# Patient Record
Sex: Female | Born: 1990 | Race: Black or African American | Hispanic: No | Marital: Married | Smoking: Current some day smoker
Health system: Southern US, Community
[De-identification: ages and names within clinical notes are randomized; demographics above are authoritative.]

## PROBLEM LIST (undated history)

## (undated) ENCOUNTER — Inpatient Hospital Stay (HOSPITAL_COMMUNITY): Payer: Self-pay

## (undated) DIAGNOSIS — A749 Chlamydial infection, unspecified: Secondary | ICD-10-CM

## (undated) DIAGNOSIS — F419 Anxiety disorder, unspecified: Secondary | ICD-10-CM

## (undated) DIAGNOSIS — Z8619 Personal history of other infectious and parasitic diseases: Secondary | ICD-10-CM

## (undated) DIAGNOSIS — D649 Anemia, unspecified: Secondary | ICD-10-CM

## (undated) DIAGNOSIS — N83209 Unspecified ovarian cyst, unspecified side: Secondary | ICD-10-CM

## (undated) DIAGNOSIS — R0600 Dyspnea, unspecified: Secondary | ICD-10-CM

## (undated) HISTORY — DX: Personal history of other infectious and parasitic diseases: Z86.19

## (undated) HISTORY — DX: Anxiety disorder, unspecified: F41.9

## (undated) HISTORY — DX: Dyspnea, unspecified: R06.00

## (undated) HISTORY — DX: Anemia, unspecified: D64.9

## (undated) HISTORY — PX: OTHER SURGICAL HISTORY: SHX169

---

## 2009-04-25 ENCOUNTER — Emergency Department (HOSPITAL_COMMUNITY): Admission: EM | Admit: 2009-04-25 | Discharge: 2009-04-26 | Payer: Self-pay | Admitting: Emergency Medicine

## 2010-05-06 DIAGNOSIS — A749 Chlamydial infection, unspecified: Secondary | ICD-10-CM

## 2010-05-06 HISTORY — DX: Chlamydial infection, unspecified: A74.9

## 2010-05-10 ENCOUNTER — Inpatient Hospital Stay (HOSPITAL_COMMUNITY)
Admission: AD | Admit: 2010-05-10 | Discharge: 2010-05-10 | Payer: Self-pay | Source: Home / Self Care | Admitting: Obstetrics and Gynecology

## 2010-08-17 LAB — CBC
HCT: 33.8 % — ABNORMAL LOW (ref 36.0–46.0)
Hemoglobin: 10.6 g/dL — ABNORMAL LOW (ref 12.0–15.0)
MCH: 22.2 pg — ABNORMAL LOW (ref 26.0–34.0)
MCHC: 31.3 g/dL (ref 30.0–36.0)
RBC: 4.77 MIL/uL (ref 3.87–5.11)

## 2010-08-17 LAB — DIFFERENTIAL
Basophils Relative: 1 % (ref 0–1)
Lymphs Abs: 1.6 10*3/uL (ref 0.7–4.0)
Monocytes Absolute: 0.8 10*3/uL (ref 0.1–1.0)
Monocytes Relative: 11 % (ref 3–12)
Neutro Abs: 4.7 10*3/uL (ref 1.7–7.7)

## 2010-08-17 LAB — URINALYSIS, ROUTINE W REFLEX MICROSCOPIC
Glucose, UA: NEGATIVE mg/dL
Ketones, ur: 15 mg/dL — AB
Protein, ur: 100 mg/dL — AB

## 2010-08-17 LAB — URINE MICROSCOPIC-ADD ON

## 2010-08-17 LAB — GC/CHLAMYDIA PROBE AMP, GENITAL: Chlamydia, DNA Probe: POSITIVE — AB

## 2010-08-17 LAB — WET PREP, GENITAL: Yeast Wet Prep HPF POC: NONE SEEN

## 2010-09-08 LAB — POCT PREGNANCY, URINE: Preg Test, Ur: NEGATIVE

## 2010-09-08 LAB — DIFFERENTIAL
Basophils Absolute: 0.1 10*3/uL (ref 0.0–0.1)
Eosinophils Relative: 1 % (ref 0–5)
Lymphocytes Relative: 39 % (ref 12–46)
Monocytes Relative: 14 % — ABNORMAL HIGH (ref 3–12)
Neutrophils Relative %: 45 % (ref 43–77)

## 2010-09-08 LAB — CBC
HCT: 32.1 % — ABNORMAL LOW (ref 36.0–46.0)
Platelets: 220 10*3/uL (ref 150–400)
RDW: 16.3 % — ABNORMAL HIGH (ref 11.5–15.5)
WBC: 8 10*3/uL (ref 4.0–10.5)

## 2010-09-08 LAB — URINE MICROSCOPIC-ADD ON

## 2010-09-08 LAB — POCT I-STAT, CHEM 8
Chloride: 107 mEq/L (ref 96–112)
HCT: 35 % — ABNORMAL LOW (ref 36.0–46.0)
Hemoglobin: 11.9 g/dL — ABNORMAL LOW (ref 12.0–15.0)
Potassium: 3.7 mEq/L (ref 3.5–5.1)
Sodium: 139 mEq/L (ref 135–145)

## 2010-09-08 LAB — URINALYSIS, ROUTINE W REFLEX MICROSCOPIC
Bilirubin Urine: NEGATIVE
Glucose, UA: NEGATIVE mg/dL
Hgb urine dipstick: NEGATIVE
Specific Gravity, Urine: 1.037 — ABNORMAL HIGH (ref 1.005–1.030)
Urobilinogen, UA: 1 mg/dL (ref 0.0–1.0)

## 2010-09-08 LAB — D-DIMER, QUANTITATIVE: D-Dimer, Quant: <0.22 ug/mL-FEU (ref 0.00–0.48)

## 2010-10-20 ENCOUNTER — Inpatient Hospital Stay (HOSPITAL_COMMUNITY)
Admission: AD | Admit: 2010-10-20 | Discharge: 2010-10-20 | Disposition: A | Discharge status: Home / Self Care | Payer: Self-pay | Source: Ambulatory Visit | Attending: Family Medicine | Admitting: Family Medicine

## 2010-10-20 DIAGNOSIS — A499 Bacterial infection, unspecified: Secondary | ICD-10-CM | POA: Insufficient documentation

## 2010-10-20 DIAGNOSIS — B9689 Other specified bacterial agents as the cause of diseases classified elsewhere: Secondary | ICD-10-CM | POA: Insufficient documentation

## 2010-10-20 DIAGNOSIS — N760 Acute vaginitis: Secondary | ICD-10-CM | POA: Insufficient documentation

## 2010-10-20 DIAGNOSIS — R109 Unspecified abdominal pain: Secondary | ICD-10-CM | POA: Insufficient documentation

## 2010-10-20 LAB — HCG, SERUM, QUALITATIVE: Preg, Serum: NEGATIVE

## 2010-10-20 LAB — URINALYSIS, ROUTINE W REFLEX MICROSCOPIC
Glucose, UA: NEGATIVE mg/dL
Ketones, ur: NEGATIVE mg/dL
Leukocytes, UA: NEGATIVE
Protein, ur: 100 mg/dL — AB
Urobilinogen, UA: 1 mg/dL (ref 0.0–1.0)

## 2010-10-20 LAB — WET PREP, GENITAL: Trich, Wet Prep: NONE SEEN

## 2010-10-20 LAB — URINE MICROSCOPIC-ADD ON

## 2010-10-21 LAB — GC/CHLAMYDIA PROBE AMP, GENITAL: Chlamydia, DNA Probe: NEGATIVE

## 2011-02-11 ENCOUNTER — Inpatient Hospital Stay (HOSPITAL_COMMUNITY): Payer: Self-pay

## 2011-02-11 ENCOUNTER — Inpatient Hospital Stay (HOSPITAL_COMMUNITY)
Admission: AD | Admit: 2011-02-11 | Discharge: 2011-02-11 | Disposition: A | Discharge status: Home / Self Care | Payer: Self-pay | Source: Ambulatory Visit | Attending: Family Medicine | Admitting: Family Medicine

## 2011-02-11 ENCOUNTER — Encounter (HOSPITAL_COMMUNITY): Payer: Self-pay | Admitting: *Deleted

## 2011-02-11 DIAGNOSIS — A499 Bacterial infection, unspecified: Secondary | ICD-10-CM | POA: Insufficient documentation

## 2011-02-11 DIAGNOSIS — R102 Pelvic and perineal pain: Secondary | ICD-10-CM

## 2011-02-11 DIAGNOSIS — F17200 Nicotine dependence, unspecified, uncomplicated: Secondary | ICD-10-CM | POA: Insufficient documentation

## 2011-02-11 DIAGNOSIS — N760 Acute vaginitis: Secondary | ICD-10-CM | POA: Insufficient documentation

## 2011-02-11 DIAGNOSIS — B9689 Other specified bacterial agents as the cause of diseases classified elsewhere: Secondary | ICD-10-CM | POA: Insufficient documentation

## 2011-02-11 DIAGNOSIS — N949 Unspecified condition associated with female genital organs and menstrual cycle: Secondary | ICD-10-CM | POA: Insufficient documentation

## 2011-02-11 HISTORY — DX: Chlamydial infection, unspecified: A74.9

## 2011-02-11 LAB — URINALYSIS, ROUTINE W REFLEX MICROSCOPIC
Bilirubin Urine: NEGATIVE
Glucose, UA: NEGATIVE mg/dL
Ketones, ur: NEGATIVE mg/dL
Protein, ur: NEGATIVE mg/dL
pH: 6.5 (ref 5.0–8.0)

## 2011-02-11 LAB — WET PREP, GENITAL
Trich, Wet Prep: NONE SEEN
Yeast Wet Prep HPF POC: NONE SEEN

## 2011-02-11 LAB — URINE MICROSCOPIC-ADD ON

## 2011-02-11 MED ORDER — METRONIDAZOLE 500 MG PO TABS: 500.0000 mg | ORAL_TABLET | Freq: Two times a day (BID) | ORAL | Status: AC

## 2011-02-11 MED ORDER — IBUPROFEN 200 MG PO TABS
200.0000 mg | ORAL_TABLET | Freq: Once | ORAL | Status: AC
  Administered 2011-02-11: 200 mg via ORAL
  Filled 2011-02-11: qty 1

## 2011-02-11 MED ORDER — IBUPROFEN 400 MG PO TABS: 200.0000 mg | ORAL_TABLET | Freq: Once | ORAL | Status: DC
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
## 2011-02-11 NOTE — Progress Notes (Signed)
Lower abd pain past 2 wks, worse at night. Eases off with meds.  Vag itching, no discharg, no pain or  Burning with urination, no freq or urgency, occ nausea

## 2011-02-12 LAB — GC/CHLAMYDIA PROBE AMP, GENITAL
Chlamydia, DNA Probe: NEGATIVE
GC Probe Amp, Genital: NEGATIVE

## 2011-02-12 NOTE — ED Provider Notes (Signed)
Chart reviewed and agree with management and plan.  

## 2012-04-08 ENCOUNTER — Emergency Department (INDEPENDENT_AMBULATORY_CARE_PROVIDER_SITE_OTHER)
Admission: EM | Admit: 2012-04-08 | Discharge: 2012-04-08 | Disposition: A | Discharge status: Home / Self Care | Payer: Self-pay | Source: Home / Self Care | Attending: Emergency Medicine | Admitting: Emergency Medicine

## 2012-04-08 ENCOUNTER — Encounter (HOSPITAL_COMMUNITY): Payer: Self-pay | Admitting: Emergency Medicine

## 2012-04-08 DIAGNOSIS — N12 Tubulo-interstitial nephritis, not specified as acute or chronic: Secondary | ICD-10-CM

## 2012-04-08 DIAGNOSIS — N898 Other specified noninflammatory disorders of vagina: Secondary | ICD-10-CM

## 2012-04-08 LAB — POCT URINALYSIS DIP (DEVICE)
Ketones, ur: NEGATIVE mg/dL
Protein, ur: >=300 mg/dL — AB
pH: 6.5 (ref 5.0–8.0)

## 2012-04-08 LAB — WET PREP, GENITAL

## 2012-04-08 MED ORDER — METRONIDAZOLE 500 MG PO TABS: 500.0000 mg | ORAL_TABLET | Freq: Two times a day (BID) | ORAL | Status: DC

## 2012-04-08 MED ORDER — CEFTRIAXONE SODIUM 250 MG IJ SOLR
250.0000 mg | Freq: Once | INTRAMUSCULAR | Status: AC
  Administered 2012-04-08: 250 mg via INTRAMUSCULAR

## 2012-04-08 MED ORDER — CEPHALEXIN 500 MG PO CAPS: 500.0000 mg | ORAL_CAPSULE | Freq: Three times a day (TID) | ORAL | Status: DC

## 2012-04-08 MED ORDER — LIDOCAINE HCL (PF) 1 % IJ SOLN
INTRAMUSCULAR | Status: AC
  Filled 2012-04-08: qty 5

## 2012-04-08 MED ORDER — CEFTRIAXONE SODIUM 1 G IJ SOLR
INTRAMUSCULAR | Status: AC
  Filled 2012-04-08: qty 10
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
## 2012-04-08 NOTE — ED Notes (Addendum)
Pt is c/o pain over left ovary x 3 days and lower back pain that started last night.   Pt had a positive home preg. Test sept 16. And a episode of spotting x 2 days oct 3-4  Second preg. Hist. Of miss carry( Pt state fell down steps)  Pt has had no prenatal care at this point

## 2012-04-09 LAB — GC/CHLAMYDIA PROBE AMP, GENITAL: Chlamydia, DNA Probe: NEGATIVE

## 2012-04-09 NOTE — ED Provider Notes (Signed)
Medical screening examination/treatment/procedure(s) were performed by non-physician practitioner and as supervising physician I was immediately available for consultation/collaboration. Recommended nurse practitioner, CH to provide patient with 1 g of CEFTRIAXONE on followup in 48 hours. On chart review was noted that patient obtain a what appears to be suboptimal dose she will review chart to determine if in fact this was the dose given and adjust treatment according to  Raynald Blend, MD 04/09/12 1835

## 2012-04-09 NOTE — ED Notes (Signed)
GC , chlamydia reports are negative; wet prep shows no trich or yeast, moderate clue cells, and few WBB's adequately treated w Keflex , metronidazole

## 2012-07-31 ENCOUNTER — Inpatient Hospital Stay (HOSPITAL_COMMUNITY): Payer: Medicaid Other

## 2012-07-31 ENCOUNTER — Encounter (HOSPITAL_COMMUNITY): Payer: Self-pay

## 2012-07-31 ENCOUNTER — Inpatient Hospital Stay (HOSPITAL_COMMUNITY)
Admission: AD | Admit: 2012-07-31 | Discharge: 2012-07-31 | Disposition: A | Discharge status: Home / Self Care | Payer: Medicaid Other | Source: Ambulatory Visit | Attending: Obstetrics & Gynecology | Admitting: Obstetrics & Gynecology

## 2012-07-31 DIAGNOSIS — R109 Unspecified abdominal pain: Secondary | ICD-10-CM | POA: Insufficient documentation

## 2012-07-31 DIAGNOSIS — O210 Mild hyperemesis gravidarum: Secondary | ICD-10-CM | POA: Insufficient documentation

## 2012-07-31 DIAGNOSIS — O99891 Other specified diseases and conditions complicating pregnancy: Secondary | ICD-10-CM | POA: Insufficient documentation

## 2012-07-31 LAB — CBC WITH DIFFERENTIAL/PLATELET
HCT: 32.3 % — ABNORMAL LOW (ref 36.0–46.0)
Hemoglobin: 10.4 g/dL — ABNORMAL LOW (ref 12.0–15.0)
Lymphocytes Relative: 29 % (ref 12–46)
Lymphs Abs: 2.1 10*3/uL (ref 0.7–4.0)
Monocytes Absolute: 0.7 10*3/uL (ref 0.1–1.0)
Monocytes Relative: 10 % (ref 3–12)
Neutro Abs: 4.5 10*3/uL (ref 1.7–7.7)
RBC: 4.58 MIL/uL (ref 3.87–5.11)
WBC: 7.4 10*3/uL (ref 4.0–10.5)

## 2012-07-31 LAB — URINALYSIS, ROUTINE W REFLEX MICROSCOPIC
Bilirubin Urine: NEGATIVE
Glucose, UA: NEGATIVE mg/dL
Hgb urine dipstick: NEGATIVE
Ketones, ur: 15 mg/dL — AB
Leukocytes, UA: NEGATIVE
Nitrite: NEGATIVE
Protein, ur: NEGATIVE mg/dL
Specific Gravity, Urine: 1.025 (ref 1.005–1.030)
Urobilinogen, UA: 1 mg/dL (ref 0.0–1.0)
pH: 7 (ref 5.0–8.0)

## 2012-07-31 LAB — WET PREP, GENITAL
Trich, Wet Prep: NONE SEEN
Yeast Wet Prep HPF POC: NONE SEEN

## 2012-07-31 MED ORDER — ONDANSETRON 8 MG PO TBDP: 8.0000 mg | ORAL_TABLET | Freq: Three times a day (TID) | ORAL | Status: DC | PRN

## 2012-07-31 MED ORDER — ONDANSETRON 8 MG PO TBDP: 8.0000 mg | ORAL_TABLET | Freq: Once | ORAL | Status: DC

## 2012-07-31 NOTE — MAU Note (Signed)
LMP 1/3, having nausea, kidney pain and stomach pain.

## 2012-07-31 NOTE — MAU Provider Note (Signed)
Attestation of Attending Supervision of Advanced Practitioner (CNM/NP): Evaluation and management procedures were performed by the Advanced Practitioner under my supervision and collaboration.  I have reviewed the Advanced Practitioner's note and chart, and I agree with the management and plan.  HARRAWAY-SMITH, Wyeth Hoffer 11:08 PM     
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
## 2012-08-01 LAB — GC/CHLAMYDIA PROBE AMP: GC Probe RNA: NEGATIVE

## 2012-08-13 ENCOUNTER — Inpatient Hospital Stay (HOSPITAL_COMMUNITY)
Admission: AD | Admit: 2012-08-13 | Discharge: 2012-08-13 | Disposition: A | Discharge status: Home / Self Care | Payer: Medicaid Other | Source: Ambulatory Visit | Attending: Obstetrics & Gynecology | Admitting: Obstetrics & Gynecology

## 2012-08-13 ENCOUNTER — Encounter (HOSPITAL_COMMUNITY): Payer: Self-pay | Admitting: *Deleted

## 2012-08-13 DIAGNOSIS — B9689 Other specified bacterial agents as the cause of diseases classified elsewhere: Secondary | ICD-10-CM | POA: Insufficient documentation

## 2012-08-13 DIAGNOSIS — O26899 Other specified pregnancy related conditions, unspecified trimester: Secondary | ICD-10-CM

## 2012-08-13 DIAGNOSIS — N760 Acute vaginitis: Secondary | ICD-10-CM | POA: Insufficient documentation

## 2012-08-13 DIAGNOSIS — O2390 Unspecified genitourinary tract infection in pregnancy, unspecified trimester: Secondary | ICD-10-CM | POA: Insufficient documentation

## 2012-08-13 DIAGNOSIS — R1032 Left lower quadrant pain: Secondary | ICD-10-CM | POA: Insufficient documentation

## 2012-08-13 LAB — URINALYSIS, ROUTINE W REFLEX MICROSCOPIC
Bilirubin Urine: NEGATIVE
Glucose, UA: NEGATIVE mg/dL
Ketones, ur: NEGATIVE mg/dL
pH: 7 (ref 5.0–8.0)

## 2012-08-13 LAB — WET PREP, GENITAL

## 2012-08-13 MED ORDER — METRONIDAZOLE 500 MG PO TABS: 500.0000 mg | ORAL_TABLET | Freq: Two times a day (BID) | ORAL | Status: AC

## 2012-08-13 NOTE — MAU Note (Signed)
Was seen in MAU 2/25. Thought she had a stomach virus and was told she was pregnant. Was positive for BV, but not treated. States She has a vag. D/C, no odor. GC/Chl, trich were negative.

## 2012-08-13 NOTE — MAU Note (Signed)
Patient states she has been having lower abdominal pain for about 3 days. Denies bleeding but does have a heavy white vaginal discharge with no odor.
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
## 2012-08-14 LAB — GC/CHLAMYDIA PROBE AMP: GC Probe RNA: NEGATIVE

## 2012-09-06 LAB — OB RESULTS CONSOLE RPR: RPR: NONREACTIVE

## 2012-09-06 LAB — OB RESULTS CONSOLE HIV ANTIBODY (ROUTINE TESTING): HIV: NONREACTIVE

## 2013-02-22 LAB — OB RESULTS CONSOLE GC/CHLAMYDIA
Chlamydia: NEGATIVE
Gonorrhea: NEGATIVE

## 2013-02-22 LAB — OB RESULTS CONSOLE GBS: GBS: POSITIVE

## 2013-03-15 ENCOUNTER — Inpatient Hospital Stay (HOSPITAL_COMMUNITY)
Admission: AD | Admit: 2013-03-15 | Discharge: 2013-03-15 | Disposition: A | Discharge status: Home / Self Care | Payer: Medicaid Other | Source: Ambulatory Visit | Attending: Obstetrics and Gynecology | Admitting: Obstetrics and Gynecology

## 2013-03-15 ENCOUNTER — Encounter (HOSPITAL_COMMUNITY): Payer: Self-pay | Admitting: *Deleted

## 2013-03-15 DIAGNOSIS — O471 False labor at or after 37 completed weeks of gestation: Secondary | ICD-10-CM

## 2013-03-15 DIAGNOSIS — O479 False labor, unspecified: Secondary | ICD-10-CM | POA: Insufficient documentation

## 2013-03-15 LAB — AMNISURE RUPTURE OF MEMBRANE (ROM) NOT AT ARMC: Amnisure ROM: NEGATIVE
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
## 2013-03-15 NOTE — MAU Note (Signed)
Pt reports her water broke at home around 45 min ago. Clear fluid. Called EMS. reports had contraction on and off all day.

## 2013-03-21 ENCOUNTER — Encounter (HOSPITAL_COMMUNITY): Payer: Self-pay | Admitting: *Deleted

## 2013-03-21 ENCOUNTER — Inpatient Hospital Stay (HOSPITAL_COMMUNITY): Payer: Medicaid Other

## 2013-03-21 ENCOUNTER — Inpatient Hospital Stay (HOSPITAL_COMMUNITY)
Admission: AD | Admit: 2013-03-21 | Discharge: 2013-03-21 | Disposition: A | Discharge status: Home / Self Care | Payer: Medicaid Other | Source: Ambulatory Visit | Attending: Obstetrics and Gynecology | Admitting: Obstetrics and Gynecology

## 2013-03-21 DIAGNOSIS — O368190 Decreased fetal movements, unspecified trimester, not applicable or unspecified: Secondary | ICD-10-CM | POA: Insufficient documentation

## 2013-03-21 MED ORDER — BUTORPHANOL TARTRATE 1 MG/ML IJ SOLN
1.0000 mg | Freq: Once | INTRAMUSCULAR | Status: AC
  Administered 2013-03-21: 1 mg via INTRAMUSCULAR
  Filled 2013-03-21: qty 1

## 2013-03-21 NOTE — MAU Note (Signed)
Scheduled for inducion on Tuesday @ 0700;

## 2013-03-21 NOTE — MAU Note (Signed)
Contractions started around 0100, every 3-4 min.  Baby not moving as much this morning. No leaking or bleeding.  abd draws up, pain shoots through to back and has pressure on anus.  Pain was worse earlier.  Last check was 1cm
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
## 2013-03-22 ENCOUNTER — Encounter (HOSPITAL_COMMUNITY): Payer: Self-pay | Admitting: *Deleted

## 2013-03-22 ENCOUNTER — Telehealth (HOSPITAL_COMMUNITY): Payer: Self-pay | Admitting: *Deleted

## 2013-03-22 NOTE — Telephone Encounter (Signed)
Preadmission screen  

## 2013-03-26 ENCOUNTER — Encounter (HOSPITAL_COMMUNITY): Payer: Self-pay

## 2013-03-26 ENCOUNTER — Inpatient Hospital Stay (HOSPITAL_COMMUNITY)
Admission: RE | Admit: 2013-03-26 | Discharge: 2013-03-30 | DRG: 765 | Disposition: A | Discharge status: Home / Self Care | Payer: Medicaid Other | Source: Ambulatory Visit | Attending: Obstetrics and Gynecology | Admitting: Obstetrics and Gynecology

## 2013-03-26 VITALS — BP 126/65 | HR 81 | Temp 98.3°F | Resp 18 | Ht 61.0 in | Wt 167.0 lb

## 2013-03-26 DIAGNOSIS — Z22330 Carrier of Group B streptococcus: Secondary | ICD-10-CM

## 2013-03-26 DIAGNOSIS — O411090 Infection of amniotic sac and membranes, unspecified, unspecified trimester, not applicable or unspecified: Secondary | ICD-10-CM | POA: Diagnosis present

## 2013-03-26 DIAGNOSIS — O99820 Streptococcus B carrier state complicating pregnancy: Secondary | ICD-10-CM

## 2013-03-26 DIAGNOSIS — D62 Acute posthemorrhagic anemia: Secondary | ICD-10-CM | POA: Diagnosis not present

## 2013-03-26 DIAGNOSIS — O99334 Smoking (tobacco) complicating childbirth: Secondary | ICD-10-CM | POA: Diagnosis present

## 2013-03-26 DIAGNOSIS — O99892 Other specified diseases and conditions complicating childbirth: Secondary | ICD-10-CM | POA: Diagnosis present

## 2013-03-26 DIAGNOSIS — F17200 Nicotine dependence, unspecified, uncomplicated: Secondary | ICD-10-CM | POA: Insufficient documentation

## 2013-03-26 DIAGNOSIS — Z98891 History of uterine scar from previous surgery: Secondary | ICD-10-CM

## 2013-03-26 DIAGNOSIS — O480 Post-term pregnancy: Secondary | ICD-10-CM | POA: Diagnosis present

## 2013-03-26 DIAGNOSIS — O9903 Anemia complicating the puerperium: Secondary | ICD-10-CM | POA: Diagnosis not present

## 2013-03-26 DIAGNOSIS — IMO0002 Reserved for concepts with insufficient information to code with codable children: Secondary | ICD-10-CM | POA: Diagnosis present

## 2013-03-26 LAB — CBC
HCT: 31.7 % — ABNORMAL LOW (ref 36.0–46.0)
Hemoglobin: 10.2 g/dL — ABNORMAL LOW (ref 12.0–15.0)
MCV: 67.4 fL — ABNORMAL LOW (ref 78.0–100.0)
Platelets: 201 10*3/uL (ref 150–400)
RBC: 4.7 MIL/uL (ref 3.87–5.11)
RDW: 14.8 % (ref 11.5–15.5)
WBC: 10 10*3/uL (ref 4.0–10.5)

## 2013-03-26 MED ORDER — PENICILLIN G POTASSIUM 5000000 UNITS IJ SOLR
2.5000 10*6.[IU] | INTRAMUSCULAR | Status: DC
  Administered 2013-03-27: 2.5 10*6.[IU] via INTRAVENOUS
  Filled 2013-03-26 (×5): qty 2.5

## 2013-03-26 MED ORDER — FENTANYL CITRATE 0.05 MG/ML IJ SOLN
100.0000 ug | INTRAMUSCULAR | Status: DC | PRN
  Administered 2013-03-27 (×2): 100 ug via INTRAVENOUS
  Filled 2013-03-26 (×2): qty 2

## 2013-03-26 MED ORDER — LACTATED RINGERS IV SOLN
500.0000 mL | INTRAVENOUS | Status: DC | PRN
  Administered 2013-03-26: 1000 mL via INTRAVENOUS

## 2013-03-26 MED ORDER — PENICILLIN G POTASSIUM 5000000 UNITS IJ SOLR
5.0000 10*6.[IU] | Freq: Once | INTRAVENOUS | Status: AC
  Administered 2013-03-26: 5 10*6.[IU] via INTRAVENOUS
  Filled 2013-03-26: qty 5

## 2013-03-26 MED ORDER — TERBUTALINE SULFATE 1 MG/ML IJ SOLN
0.2500 mg | Freq: Once | INTRAMUSCULAR | Status: AC | PRN
  Filled 2013-03-26: qty 1

## 2013-03-26 MED ORDER — ACETAMINOPHEN 325 MG PO TABS
650.0000 mg | ORAL_TABLET | ORAL | Status: DC | PRN
  Administered 2013-03-27: 650 mg via ORAL
  Filled 2013-03-26: qty 2

## 2013-03-26 MED ORDER — OXYTOCIN 40 UNITS IN LACTATED RINGERS INFUSION - SIMPLE MED
1.0000 m[IU]/min | INTRAVENOUS | Status: DC
  Administered 2013-03-26: 1 m[IU]/min via INTRAVENOUS
  Administered 2013-03-27: 7 m[IU]/min via INTRAVENOUS
  Filled 2013-03-26: qty 1000

## 2013-03-26 MED ORDER — CITRIC ACID-SODIUM CITRATE 334-500 MG/5ML PO SOLN
30.0000 mL | ORAL | Status: DC | PRN
  Administered 2013-03-27: 30 mL via ORAL
  Filled 2013-03-26: qty 15

## 2013-03-26 MED ORDER — OXYTOCIN 40 UNITS IN LACTATED RINGERS INFUSION - SIMPLE MED: 62.5000 mL/h | INTRAVENOUS | Status: DC

## 2013-03-26 MED ORDER — OXYCODONE-ACETAMINOPHEN 5-325 MG PO TABS: 1.0000 | ORAL_TABLET | ORAL | Status: DC | PRN

## 2013-03-26 MED ORDER — ONDANSETRON HCL 4 MG/2ML IJ SOLN: 4.0000 mg | Freq: Four times a day (QID) | INTRAMUSCULAR | Status: DC | PRN

## 2013-03-26 MED ORDER — LIDOCAINE HCL (PF) 1 % IJ SOLN: 30.0000 mL | INTRAMUSCULAR | Status: DC | PRN

## 2013-03-26 MED ORDER — OXYTOCIN BOLUS FROM INFUSION: 500.0000 mL | INTRAVENOUS | Status: DC

## 2013-03-26 MED ORDER — IBUPROFEN 600 MG PO TABS: 600.0000 mg | ORAL_TABLET | Freq: Four times a day (QID) | ORAL | Status: DC | PRN

## 2013-03-26 MED ORDER — LACTATED RINGERS IV SOLN
INTRAVENOUS | Status: DC
  Administered 2013-03-26 – 2013-03-27 (×4): via INTRAVENOUS

## 2013-03-26 NOTE — Progress Notes (Signed)
  Subjective: Called to [facility] for a prolong decel.  Pt was in trendelenburg on left side with bolus going and O2.  RN reported already having pt on right side and on hands and knees.  SVE preformed, during SVE Dr. Macon Large entered and suggested FSE, one failed attempt with ROM - thick particulate mec noted.  Dr. Macon Large was able to place FSE and also placed IUPC.  Dr. Stefano Gaul then entered room as FHT returned to baseline.  Dr. Stefano Gaul offered reassurance to pt and discussed the potential need for c/s if baby does not tolerate labor.  R/B/A of c/s discussed.      Objective: BP 134/9  Pulse 87  Temp(Src) 98.4 F (36.9 C) (Oral)  Resp 18  Ht 5\' 1"  (1.549 m)  Wt 178 lb (80.74 kg)  BMI 33.65 kg/m2  LMP 06/08/2012      FHT:  FHR: 130 bpm, variability: moderate,  accelerations:  Present,  decelerations:  Present at 2046 FHT went down to 50 for about 10 min returning to baseline with min variability.  UC:   regular, every 2-4 minutes  SVE:   Dilation: 3 Effacement (%): 70 Station: -2 Exam by:: harraway smith md  Assessment / Plan:  Labor: IOL for PD; Pitocin delayed to observe FHT  Preeclampsia: no s/s Fetal Wellbeing: Cat II Pain Control: Breathing and relaxation; UCs have become more intense since AROM I/D: GBS pos; PCN prophylaxis; AROM at 2045 - thick mec; afibrile Anticipated MOD: SVD vs c/s    Cathy Elliott 03/26/2013, 9:14 PM

## 2013-03-26 NOTE — Progress Notes (Signed)
I was called to see this patient who had a prolonged fetal heart rate deceleration. The deceleration has now resolved. I reviewed the risks of fetal intolerance of labor. Cesarean delivery was outlined. Risks and benefits were reviewed. Questions were answered. The permit was signed. The patients contractions are increasing. We will observe for now.  Dr. Stefano Gaul
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
## 2013-03-26 NOTE — Progress Notes (Signed)
FHR= Cat 1. Dr. Stefano Gaul

## 2013-03-26 NOTE — Progress Notes (Signed)
  Subjective: Observing FHT since prolong decel - Variability is moderate, BL 130, no decels, accelerations presents.  Objective: BP 129/84  Pulse 73  Temp(Src) 98 F (36.7 C) (Oral)  Resp 20  Ht 5\' 1"  (1.549 m)  Wt 178 lb (80.74 kg)  BMI 33.65 kg/m2  SpO2 100%  LMP 06/08/2012      FHT:  Cat I UC:   regular, every 2-3 minutes   Assessment / Plan:  Labor: IOL for PD; Pitocin started with close observation of FHT Preeclampsia: 151/96 during decel; likely d/t anxiety;  no s/s; will continue to observe closely Fetal Wellbeing: Cat I Pain Control: Breathing and relaxation I/D: GBS pos; PCN prophylaxis; AROM at 2045 - thick mec; afibrile  Anticipated MOD: SVD vs c/s     Cathy Elliott 03/26/2013, 10:02 PM

## 2013-03-27 ENCOUNTER — Encounter (HOSPITAL_COMMUNITY): Payer: Self-pay

## 2013-03-27 ENCOUNTER — Inpatient Hospital Stay (HOSPITAL_COMMUNITY): Payer: Medicaid Other | Admitting: Anesthesiology

## 2013-03-27 ENCOUNTER — Encounter (HOSPITAL_COMMUNITY)
Admission: RE | Disposition: A | Discharge status: Home / Self Care | Payer: Self-pay | Source: Ambulatory Visit | Attending: Obstetrics and Gynecology

## 2013-03-27 ENCOUNTER — Encounter (HOSPITAL_COMMUNITY): Payer: Medicaid Other | Admitting: Anesthesiology

## 2013-03-27 LAB — COMPREHENSIVE METABOLIC PANEL
ALT: 9 U/L (ref 0–35)
AST: 21 U/L (ref 0–37)
AST: 20 U/L (ref 0–37)
Albumin: 2.9 g/dL — ABNORMAL LOW (ref 3.5–5.2)
Albumin: 2.5 g/dL — ABNORMAL LOW (ref 3.5–5.2)
Alkaline Phosphatase: 161 U/L — ABNORMAL HIGH (ref 39–117)
Alkaline Phosphatase: 153 U/L — ABNORMAL HIGH (ref 39–117)
BUN: 7 mg/dL (ref 6–23)
CO2: 21 mEq/L (ref 19–32)
Calcium: 9.5 mg/dL (ref 8.4–10.5)
Chloride: 105 mEq/L (ref 96–112)
Chloride: 103 mEq/L (ref 96–112)
Creatinine, Ser: 0.64 mg/dL (ref 0.50–1.10)
GFR calc Af Amer: >90 mL/min (ref >90)
Potassium: 4.2 mEq/L (ref 3.5–5.1)
Potassium: 4 mEq/L (ref 3.5–5.1)
Sodium: 134 mEq/L — ABNORMAL LOW (ref 135–145)
Total Bilirubin: 0.2 mg/dL — ABNORMAL LOW (ref 0.3–1.2)
Total Bilirubin: 0.4 mg/dL (ref 0.3–1.2)
Total Protein: 6.1 g/dL (ref 6.0–8.3)

## 2013-03-27 LAB — TYPE AND SCREEN
ABO/RH(D): A POS
Antibody Screen: NEGATIVE

## 2013-03-27 LAB — CBC
HCT: 29.4 % — ABNORMAL LOW (ref 36.0–46.0)
MCH: 21.8 pg — ABNORMAL LOW (ref 26.0–34.0)
MCHC: 33 g/dL (ref 30.0–36.0)
MCHC: 32.5 g/dL (ref 30.0–36.0)
MCV: 67.3 fL — ABNORMAL LOW (ref 78.0–100.0)
Platelets: 208 10*3/uL (ref 150–400)
Platelets: 170 10*3/uL (ref 150–400)
Platelets: 225 10*3/uL (ref 150–400)
RBC: 4.28 MIL/uL (ref 3.87–5.11)
RBC: 4.67 MIL/uL (ref 3.87–5.11)
RDW: 14.9 % (ref 11.5–15.5)
RDW: 14.8 % (ref 11.5–15.5)
WBC: 19.6 10*3/uL — ABNORMAL HIGH (ref 4.0–10.5)
WBC: 21.4 10*3/uL — ABNORMAL HIGH (ref 4.0–10.5)
WBC: 20.1 10*3/uL — ABNORMAL HIGH (ref 4.0–10.5)

## 2013-03-27 LAB — URIC ACID
Uric Acid, Serum: 5 mg/dL (ref 2.4–7.0)
Uric Acid, Serum: 5.1 mg/dL (ref 2.4–7.0)

## 2013-03-27 LAB — LACTATE DEHYDROGENASE: LDH: 180 U/L (ref 94–250)

## 2013-03-27 LAB — PROTEIN / CREATININE RATIO, URINE
Creatinine, Urine: 75.74 mg/dL
Protein Creatinine Ratio: 0.55 — ABNORMAL HIGH (ref 0.00–0.15)

## 2013-03-27 SURGERY — Surgical Case
Anesthesia: Epidural | Site: Abdomen | Wound class: Clean Contaminated

## 2013-03-27 MED ORDER — MEPERIDINE HCL 25 MG/ML IJ SOLN: 6.2500 mg | INTRAMUSCULAR | Status: DC | PRN

## 2013-03-27 MED ORDER — LACTATED RINGERS IV SOLN
INTRAVENOUS | Status: DC
  Administered 2013-03-27: 11:00:00 via INTRAUTERINE

## 2013-03-27 MED ORDER — FERROUS SULFATE 325 (65 FE) MG PO TABS
325.0000 mg | ORAL_TABLET | Freq: Two times a day (BID) | ORAL | Status: DC
  Administered 2013-03-27 – 2013-03-30 (×6): 325 mg via ORAL
  Filled 2013-03-27 (×6): qty 1

## 2013-03-27 MED ORDER — NALBUPHINE SYRINGE 5 MG/0.5 ML
5.0000 mg | INJECTION | INTRAMUSCULAR | Status: DC | PRN
  Filled 2013-03-27: qty 1

## 2013-03-27 MED ORDER — SIMETHICONE 80 MG PO CHEW: 80.0000 mg | CHEWABLE_TABLET | ORAL | Status: DC | PRN

## 2013-03-27 MED ORDER — SCOPOLAMINE 1 MG/3DAYS TD PT72
1.0000 | MEDICATED_PATCH | Freq: Once | TRANSDERMAL | Status: DC
  Administered 2013-03-27: 1.5 mg via TRANSDERMAL

## 2013-03-27 MED ORDER — DIPHENHYDRAMINE HCL 25 MG PO CAPS
25.0000 mg | ORAL_CAPSULE | ORAL | Status: DC | PRN
  Filled 2013-03-27: qty 1

## 2013-03-27 MED ORDER — DIPHENHYDRAMINE HCL 50 MG/ML IJ SOLN: 25.0000 mg | INTRAMUSCULAR | Status: DC | PRN

## 2013-03-27 MED ORDER — MAGNESIUM SULFATE 40 G IN LACTATED RINGERS - SIMPLE: 2.0000 g/h | Freq: Once | INTRAVENOUS | Status: DC

## 2013-03-27 MED ORDER — DIBUCAINE 1 % RE OINT: 1.0000 "application " | TOPICAL_OINTMENT | RECTAL | Status: DC | PRN

## 2013-03-27 MED ORDER — FLEET ENEMA 7-19 GM/118ML RE ENEM: 1.0000 | ENEMA | Freq: Every day | RECTAL | Status: DC | PRN

## 2013-03-27 MED ORDER — ZOLPIDEM TARTRATE 5 MG PO TABS: 5.0000 mg | ORAL_TABLET | Freq: Every evening | ORAL | Status: DC | PRN

## 2013-03-27 MED ORDER — MEASLES, MUMPS & RUBELLA VAC ~~LOC~~ INJ: 0.5000 mL | INJECTION | Freq: Once | SUBCUTANEOUS | Status: DC

## 2013-03-27 MED ORDER — ONDANSETRON HCL 4 MG PO TABS: 4.0000 mg | ORAL_TABLET | ORAL | Status: DC | PRN

## 2013-03-27 MED ORDER — PHENYLEPHRINE 8 MG IN D5W 100 ML (0.08MG/ML) PREMIX OPTIME: INJECTION | INTRAVENOUS | Status: DC | PRN

## 2013-03-27 MED ORDER — ONDANSETRON HCL 4 MG/2ML IJ SOLN: 4.0000 mg | Freq: Three times a day (TID) | INTRAMUSCULAR | Status: DC | PRN

## 2013-03-27 MED ORDER — LACTATED RINGERS IV SOLN
INTRAVENOUS | Status: DC | PRN
  Administered 2013-03-27 (×2): via INTRAVENOUS

## 2013-03-27 MED ORDER — EPHEDRINE 5 MG/ML INJ
10.0000 mg | INTRAVENOUS | Status: DC | PRN
  Filled 2013-03-27: qty 4

## 2013-03-27 MED ORDER — MORPHINE SULFATE (PF) 0.5 MG/ML IJ SOLN
INTRAMUSCULAR | Status: DC | PRN
  Administered 2013-03-27: 4 mg via EPIDURAL

## 2013-03-27 MED ORDER — MAGNESIUM SULFATE 40 G IN LACTATED RINGERS - SIMPLE
2.0000 g/h | INTRAVENOUS | Status: DC
  Administered 2013-03-27: 2 g/h via INTRAVENOUS
  Filled 2013-03-27: qty 500

## 2013-03-27 MED ORDER — SCOPOLAMINE 1 MG/3DAYS TD PT72
MEDICATED_PATCH | TRANSDERMAL | Status: AC
  Filled 2013-03-27: qty 1

## 2013-03-27 MED ORDER — LANOLIN HYDROUS EX OINT: 1.0000 "application " | TOPICAL_OINTMENT | CUTANEOUS | Status: DC | PRN

## 2013-03-27 MED ORDER — MORPHINE SULFATE 0.5 MG/ML IJ SOLN
INTRAMUSCULAR | Status: AC
  Filled 2013-03-27: qty 10

## 2013-03-27 MED ORDER — AMPICILLIN-SULBACTAM SODIUM 3 (2-1) G IJ SOLR
3.0000 g | Freq: Four times a day (QID) | INTRAMUSCULAR | Status: AC
  Administered 2013-03-27 – 2013-03-28 (×4): 3 g via INTRAVENOUS
  Filled 2013-03-27 (×7): qty 3

## 2013-03-27 MED ORDER — NALOXONE HCL 0.4 MG/ML IJ SOLN: 0.4000 mg | INTRAMUSCULAR | Status: DC | PRN

## 2013-03-27 MED ORDER — DIPHENHYDRAMINE HCL 50 MG/ML IJ SOLN: 12.5000 mg | INTRAMUSCULAR | Status: DC | PRN

## 2013-03-27 MED ORDER — KETOROLAC TROMETHAMINE 60 MG/2ML IM SOLN
INTRAMUSCULAR | Status: AC
  Administered 2013-03-27: 60 mg via INTRAMUSCULAR
  Filled 2013-03-27: qty 2

## 2013-03-27 MED ORDER — OXYTOCIN 10 UNIT/ML IJ SOLN
40.0000 [IU] | INTRAVENOUS | Status: DC | PRN
  Administered 2013-03-27: 40 [IU] via INTRAVENOUS

## 2013-03-27 MED ORDER — OXYCODONE-ACETAMINOPHEN 5-325 MG PO TABS
1.0000 | ORAL_TABLET | ORAL | Status: DC | PRN
  Administered 2013-03-28 – 2013-03-29 (×4): 1 via ORAL
  Filled 2013-03-27 (×4): qty 1

## 2013-03-27 MED ORDER — SIMETHICONE 80 MG PO CHEW
80.0000 mg | CHEWABLE_TABLET | Freq: Three times a day (TID) | ORAL | Status: DC
  Administered 2013-03-27 – 2013-03-30 (×7): 80 mg via ORAL
  Filled 2013-03-27 (×9): qty 1

## 2013-03-27 MED ORDER — KETOROLAC TROMETHAMINE 30 MG/ML IJ SOLN: 30.0000 mg | Freq: Four times a day (QID) | INTRAMUSCULAR | Status: DC | PRN

## 2013-03-27 MED ORDER — MAGNESIUM SULFATE 40 G IN LACTATED RINGERS - SIMPLE
2.0000 g/h | INTRAVENOUS | Status: AC
  Administered 2013-03-28: 2 g/h via INTRAVENOUS
  Filled 2013-03-27: qty 500

## 2013-03-27 MED ORDER — NALOXONE HCL 1 MG/ML IJ SOLN
1.0000 ug/kg/h | INTRAVENOUS | Status: DC | PRN
  Filled 2013-03-27: qty 2

## 2013-03-27 MED ORDER — MENTHOL 3 MG MT LOZG: 1.0000 | LOZENGE | OROMUCOSAL | Status: DC | PRN

## 2013-03-27 MED ORDER — LACTATED RINGERS IV SOLN
500.0000 mL | Freq: Once | INTRAVENOUS | Status: AC
  Administered 2013-03-27: 500 mL via INTRAVENOUS

## 2013-03-27 MED ORDER — FENTANYL CITRATE 0.05 MG/ML IJ SOLN
25.0000 ug | INTRAMUSCULAR | Status: DC | PRN
  Administered 2013-03-27 (×2): 50 ug via INTRAVENOUS

## 2013-03-27 MED ORDER — KETOROLAC TROMETHAMINE 60 MG/2ML IM SOLN
60.0000 mg | Freq: Once | INTRAMUSCULAR | Status: AC | PRN
  Administered 2013-03-27: 60 mg via INTRAMUSCULAR

## 2013-03-27 MED ORDER — SODIUM CHLORIDE 0.9 % IV SOLN
3.0000 g | Freq: Four times a day (QID) | INTRAVENOUS | Status: DC
  Administered 2013-03-27 (×2): 3 g via INTRAVENOUS
  Filled 2013-03-27 (×3): qty 3

## 2013-03-27 MED ORDER — FENTANYL 2.5 MCG/ML BUPIVACAINE 1/10 % EPIDURAL INFUSION (WH - ANES)
14.0000 mL/h | INTRAMUSCULAR | Status: DC | PRN
  Administered 2013-03-27: 14 mL/h via EPIDURAL
  Filled 2013-03-27: qty 125

## 2013-03-27 MED ORDER — ONDANSETRON HCL 4 MG/2ML IJ SOLN: 4.0000 mg | INTRAMUSCULAR | Status: DC | PRN

## 2013-03-27 MED ORDER — PHENYLEPHRINE HCL 10 MG/ML IJ SOLN
INTRAMUSCULAR | Status: DC | PRN
  Administered 2013-03-27 (×2): 80 ug via INTRAVENOUS
  Administered 2013-03-27: 40 ug via INTRAVENOUS

## 2013-03-27 MED ORDER — FENTANYL CITRATE 0.05 MG/ML IJ SOLN
INTRAMUSCULAR | Status: AC
  Administered 2013-03-27: 50 ug via INTRAVENOUS
  Filled 2013-03-27: qty 2

## 2013-03-27 MED ORDER — BISACODYL 10 MG RE SUPP: 10.0000 mg | Freq: Every day | RECTAL | Status: DC | PRN

## 2013-03-27 MED ORDER — PRENATAL MULTIVITAMIN CH
1.0000 | ORAL_TABLET | Freq: Every day | ORAL | Status: DC
  Administered 2013-03-28 – 2013-03-30 (×3): 1 via ORAL
  Filled 2013-03-27 (×3): qty 1

## 2013-03-27 MED ORDER — OXYTOCIN 40 UNITS IN LACTATED RINGERS INFUSION - SIMPLE MED
62.5000 mL/h | INTRAVENOUS | Status: AC
  Administered 2013-03-27: 62.5 mL/h via INTRAVENOUS
  Filled 2013-03-27: qty 1000

## 2013-03-27 MED ORDER — FENTANYL CITRATE 0.05 MG/ML IJ SOLN
INTRAMUSCULAR | Status: AC
  Filled 2013-03-27: qty 2

## 2013-03-27 MED ORDER — EPHEDRINE 5 MG/ML INJ: 10.0000 mg | INTRAVENOUS | Status: DC | PRN

## 2013-03-27 MED ORDER — MAGNESIUM SULFATE BOLUS VIA INFUSION
4.0000 g | Freq: Once | INTRAVENOUS | Status: AC
  Administered 2013-03-27: 4 g via INTRAVENOUS
  Filled 2013-03-27: qty 500

## 2013-03-27 MED ORDER — ONDANSETRON HCL 4 MG/2ML IJ SOLN
INTRAMUSCULAR | Status: DC | PRN
  Administered 2013-03-27: 4 mg via INTRAVENOUS

## 2013-03-27 MED ORDER — PHENYLEPHRINE 40 MCG/ML (10ML) SYRINGE FOR IV PUSH (FOR BLOOD PRESSURE SUPPORT): 80.0000 ug | PREFILLED_SYRINGE | INTRAVENOUS | Status: DC | PRN

## 2013-03-27 MED ORDER — PHENYLEPHRINE HCL 10 MG/ML IJ SOLN
INTRAMUSCULAR | Status: AC
  Filled 2013-03-27: qty 1

## 2013-03-27 MED ORDER — SODIUM BICARBONATE 8.4 % IV SOLN
INTRAVENOUS | Status: DC | PRN
  Administered 2013-03-27 (×3): 5 mL via EPIDURAL

## 2013-03-27 MED ORDER — SIMETHICONE 80 MG PO CHEW
80.0000 mg | CHEWABLE_TABLET | ORAL | Status: DC
  Administered 2013-03-27 – 2013-03-30 (×3): 80 mg via ORAL
  Filled 2013-03-27 (×2): qty 1

## 2013-03-27 MED ORDER — MORPHINE SULFATE (PF) 0.5 MG/ML IJ SOLN
INTRAMUSCULAR | Status: DC | PRN
  Administered 2013-03-27 (×2): .5 mg via EPIDURAL

## 2013-03-27 MED ORDER — LACTATED RINGERS IV SOLN
INTRAVENOUS | Status: DC
  Administered 2013-03-27: via INTRAVENOUS

## 2013-03-27 MED ORDER — LACTATED RINGERS IV SOLN: INTRAVENOUS | Status: DC | PRN

## 2013-03-27 MED ORDER — SENNOSIDES-DOCUSATE SODIUM 8.6-50 MG PO TABS
2.0000 | ORAL_TABLET | ORAL | Status: DC
  Administered 2013-03-27 – 2013-03-30 (×3): 2 via ORAL
  Filled 2013-03-27 (×3): qty 2

## 2013-03-27 MED ORDER — METOCLOPRAMIDE HCL 5 MG/ML IJ SOLN: 10.0000 mg | Freq: Three times a day (TID) | INTRAMUSCULAR | Status: DC | PRN

## 2013-03-27 MED ORDER — IBUPROFEN 600 MG PO TABS
600.0000 mg | ORAL_TABLET | Freq: Four times a day (QID) | ORAL | Status: DC
  Administered 2013-03-27 – 2013-03-30 (×11): 600 mg via ORAL
  Filled 2013-03-27 (×11): qty 1

## 2013-03-27 MED ORDER — WITCH HAZEL-GLYCERIN EX PADS
1.0000 | MEDICATED_PAD | CUTANEOUS | Status: DC | PRN
Start: 2013-03-27 — End: 2013-03-30

## 2013-03-27 MED ORDER — SODIUM CHLORIDE 0.9 % IJ SOLN: 3.0000 mL | INTRAMUSCULAR | Status: DC | PRN

## 2013-03-27 MED ORDER — LIDOCAINE HCL (PF) 1 % IJ SOLN
INTRAMUSCULAR | Status: DC | PRN
  Administered 2013-03-27 (×2): 5 mL

## 2013-03-27 MED ORDER — PHENYLEPHRINE 40 MCG/ML (10ML) SYRINGE FOR IV PUSH (FOR BLOOD PRESSURE SUPPORT)
80.0000 ug | PREFILLED_SYRINGE | INTRAVENOUS | Status: DC | PRN
  Filled 2013-03-27: qty 5

## 2013-03-27 MED ORDER — TETANUS-DIPHTH-ACELL PERTUSSIS 5-2.5-18.5 LF-MCG/0.5 IM SUSP
0.5000 mL | Freq: Once | INTRAMUSCULAR | Status: AC
  Administered 2013-03-28: 0.5 mL via INTRAMUSCULAR
  Filled 2013-03-27: qty 0.5

## 2013-03-27 MED ORDER — DIPHENHYDRAMINE HCL 25 MG PO CAPS
25.0000 mg | ORAL_CAPSULE | Freq: Four times a day (QID) | ORAL | Status: DC | PRN
  Administered 2013-03-28: 25 mg via ORAL
  Filled 2013-03-27: qty 1

## 2013-03-27 MED ORDER — 0.9 % SODIUM CHLORIDE (POUR BTL) OPTIME
TOPICAL | Status: DC | PRN
  Administered 2013-03-27: 1000 mL

## 2013-03-27 SURGICAL SUPPLY — 37 items
BENZOIN TINCTURE PRP APPL 2/3 (GAUZE/BANDAGES/DRESSINGS) ×2 IMPLANT
CLAMP CORD UMBIL (MISCELLANEOUS) ×2 IMPLANT
CLOTH BEACON ORANGE TIMEOUT ST (SAFETY) ×2 IMPLANT
CONTAINER PREFILL 10% NBF 15ML (MISCELLANEOUS) IMPLANT
DRAIN JACKSON PRT FLT 10 (DRAIN) IMPLANT
DRAPE LG THREE QUARTER DISP (DRAPES) ×4 IMPLANT
DRSG OPSITE POSTOP 4X10 (GAUZE/BANDAGES/DRESSINGS) ×2 IMPLANT
DURAPREP 26ML APPLICATOR (WOUND CARE) ×2 IMPLANT
ELECT REM PT RETURN 9FT ADLT (ELECTROSURGICAL) ×2
ELECTRODE REM PT RTRN 9FT ADLT (ELECTROSURGICAL) ×1 IMPLANT
EVACUATOR SILICONE 100CC (DRAIN) IMPLANT
EXTRACTOR VACUUM M CUP 4 TUBE (SUCTIONS) ×2 IMPLANT
GLOVE BIO SURGEON STRL SZ 6.5 (GLOVE) ×2 IMPLANT
GLOVE BIOGEL PI IND STRL 7.0 (GLOVE) ×2 IMPLANT
GLOVE BIOGEL PI INDICATOR 7.0 (GLOVE) ×2
GOWN PREVENTION PLUS XLARGE (GOWN DISPOSABLE) ×2 IMPLANT
GOWN STRL REIN XL XLG (GOWN DISPOSABLE) ×4 IMPLANT
HEMOSTAT SURGICEL 4X8 (HEMOSTASIS) ×2 IMPLANT
KIT ABG SYR 3ML LUER SLIP (SYRINGE) ×6 IMPLANT
NEEDLE HYPO 25X5/8 SAFETYGLIDE (NEEDLE) ×6 IMPLANT
NS IRRIG 1000ML POUR BTL (IV SOLUTION) ×2 IMPLANT
PACK C SECTION WH (CUSTOM PROCEDURE TRAY) ×2 IMPLANT
PAD OB MATERNITY 4.3X12.25 (PERSONAL CARE ITEMS) ×2 IMPLANT
RTRCTR C-SECT PINK 25CM LRG (MISCELLANEOUS) ×2 IMPLANT
STAPLER VISISTAT 35W (STAPLE) IMPLANT
STRIP CLOSURE SKIN 1/2X4 (GAUZE/BANDAGES/DRESSINGS) ×2 IMPLANT
SUT CHROMIC 0 CT 1 (SUTURE) ×2 IMPLANT
SUT MNCRL AB 3-0 PS2 27 (SUTURE) ×2 IMPLANT
SUT PLAIN 2 0 (SUTURE)
SUT PLAIN 2 0 XLH (SUTURE) ×2 IMPLANT
SUT PLAIN ABS 2-0 CT1 27XMFL (SUTURE) IMPLANT
SUT SILK 2 0 SH (SUTURE) IMPLANT
SUT VIC AB 0 CTX 36 (SUTURE) ×4
SUT VIC AB 0 CTX36XBRD ANBCTRL (SUTURE) ×4 IMPLANT
TOWEL OR 17X24 6PK STRL BLUE (TOWEL DISPOSABLE) ×2 IMPLANT
TRAY FOLEY CATH 14FR (SET/KITS/TRAYS/PACK) IMPLANT
WATER STERILE IRR 1000ML POUR (IV SOLUTION) IMPLANT

## 2013-03-27 NOTE — Progress Notes (Signed)
Came by to see patient--sitting up in bed, comfortable.  Denies HA, visual sx, epigastric pain. Baby at bedside, family visiting.  Filed Vitals:   03/27/13 1700 03/27/13 1748 03/27/13 1752 03/27/13 1900  BP:  128/67  134/74  Pulse: 111 98 92 100  Temp:  98.4 F (36.9 C)    TempSrc:  Oral    Resp:  16    Height:      Weight:      SpO2: 99% 98% 100% 100%   Magnesium on 2g/hour--delivery at 1:12p.  Will CTO in AICU. Plan 24 hours of magnesium.  Nigel Bridgeman, CNM 03/27/13 8:40p

## 2013-03-27 NOTE — Progress Notes (Signed)
Subjective: Called to [facility] for rising temp.  BP continues to be elevated.  Pt breathing well with UCs.  Objective: BP 136/80  Pulse 98  Temp(Src) 100.5 F (38.1 C) (Oral)  Resp 20  Ht 5\' 1"  (1.549 m)  Wt 178 lb (80.74 kg)  BMI 33.65 kg/m2  SpO2 100%  LMP 06/08/2012   Total I/O In: 150 [P.O.:150] Out: 600 [Urine:600]  FHT:  Cat II UC:   regular, every 2-4 minutes  SVE:   Dilation: 4.5 Effacement (%): 70 Station: -2 Exam by:: e. poore, rn  Filed Vitals:   03/27/13 4540 03/27/13 0538 03/27/13 0548 03/27/13 0629  BP: 120/62 120/62 119/54 136/80  Pulse: 95 101 103 98  Temp:    100.5 F (38.1 C)  TempSrc:    Oral  Resp: 20 20 20 20   Height:      Weight:      SpO2:       Results for orders placed during the hospital encounter of 03/26/13 (from the past 24 hour(s))  CBC     Status: Abnormal   Collection Time    03/26/13  7:35 PM      Result Value Range   WBC 10.0  4.0 - 10.5 K/uL   RBC 4.70  3.87 - 5.11 MIL/uL   Hemoglobin 10.2 (*) 12.0 - 15.0 g/dL   HCT 98.1 (*) 19.1 - 47.8 %   MCV 67.4 (*) 78.0 - 100.0 fL   MCH 21.7 (*) 26.0 - 34.0 pg   MCHC 32.2  30.0 - 36.0 g/dL   RDW 29.5  62.1 - 30.8 %   Platelets 201  150 - 400 K/uL  RPR     Status: None   Collection Time    03/26/13  7:35 PM      Result Value Range   RPR NON REACTIVE  NON REACTIVE  COMPREHENSIVE METABOLIC PANEL     Status: Abnormal   Collection Time    03/26/13  7:35 PM      Result Value Range   Sodium 137  135 - 145 mEq/L   Potassium 4.2  3.5 - 5.1 mEq/L   Chloride 105  96 - 112 mEq/L   CO2 21  19 - 32 mEq/L   Glucose, Bld 101 (*) 70 - 99 mg/dL   BUN 11  6 - 23 mg/dL   Creatinine, Ser 6.57  0.50 - 1.10 mg/dL   Calcium 9.5  8.4 - 84.6 mg/dL   Total Protein 6.7  6.0 - 8.3 g/dL   Albumin 2.9 (*) 3.5 - 5.2 g/dL   AST 21  0 - 37 U/L   ALT 11  0 - 35 U/L   Alkaline Phosphatase 161 (*) 39 - 117 U/L   Total Bilirubin 0.2 (*) 0.3 - 1.2 mg/dL   GFR calc non Af Amer >90  >90 mL/min   GFR calc Af Amer  >90  >90 mL/min  LACTATE DEHYDROGENASE     Status: None   Collection Time    03/26/13  7:35 PM      Result Value Range   LDH 168  94 - 250 U/L  URIC ACID     Status: None   Collection Time    03/26/13  7:35 PM      Result Value Range   Uric Acid, Serum 5.0  2.4 - 7.0 mg/dL  PROTEIN / CREATININE RATIO, URINE     Status: Abnormal   Collection Time    03/27/13  2:20  AM      Result Value Range   Creatinine, Urine 75.74     Total Protein, Urine 41.8     PROTEIN CREATININE RATIO 0.55 (*) 0.00 - 0.15   Assessment / Plan:  C/w Dr. Stefano Gaul re. temp and PCR   Labor: IOL for PD; Pitocin at 6 milliU; MVUs 140 - 200  Preeclampsia: PIH labs above; no s/s; Mag sulfate prophylaxis started  Fetal Wellbeing: Cat II Pain Control: Breathing and relaxation  I/D: GBS pos; ROM x 7 hours; Temp 101; Unasyn 3g Q6 hours started  Anticipated MOD: SVD   Cathy Elliott 03/27/2013, 6:42 AM

## 2013-03-27 NOTE — Transfer of Care (Signed)
Immediate Anesthesia Transfer of Care Note  Patient: Cathy Elliott  Procedure(s) Performed: Procedure(s): CESAREAN SECTION (N/A)  Patient Location: PACU  Anesthesia Type:Regional  Level of Consciousness: awake, alert  and oriented  Airway & Oxygen Therapy: Patient Spontanous Breathing  Post-op Assessment: Report given to PACU RN and Post -op Vital signs reviewed and stable  Post vital signs: Reviewed and stable  Complications: No apparent anesthesia complications

## 2013-03-27 NOTE — Anesthesia Preprocedure Evaluation (Addendum)
Anesthesia Evaluation  Patient identified by MRN, date of birth, ID band Patient awake    Reviewed: Allergy & Precautions, H&P , Patient's Chart, lab work & pertinent test results  Airway Mallampati: II TM Distance: >3 FB Neck ROM: full    Dental no notable dental hx.    Pulmonary neg pulmonary ROS,  breath sounds clear to auscultation  Pulmonary exam normal       Cardiovascular hypertension, negative cardio ROS  Rhythm:regular Rate:Normal     Neuro/Psych negative neurological ROS  negative psych ROS   GI/Hepatic negative GI ROS, Neg liver ROS,   Endo/Other  negative endocrine ROS  Renal/GU negative Renal ROS     Musculoskeletal   Abdominal   Peds  Hematology  (+) anemia ,   Anesthesia Other Findings   Reproductive/Obstetrics (+) Pregnancy (NRFHR --> STAT C/S)                          Anesthesia Physical Anesthesia Plan  ASA: III and emergent  Anesthesia Plan: Epidural   Post-op Pain Management:    Induction:   Airway Management Planned:   Additional Equipment:   Intra-op Plan:   Post-operative Plan:   Informed Consent: I have reviewed the patients History and Physical, chart, labs and discussed the procedure including the risks, benefits and alternatives for the proposed anesthesia with the patient or authorized representative who has indicated his/her understanding and acceptance.     Plan Discussed with: Surgeon and CRNA  Anesthesia Plan Comments:       chorio on abx Anesthesia Quick Evaluation

## 2013-03-27 NOTE — Op Note (Signed)
Cesarean Section Procedure Note   Vicky Schleich  03/26/2013 - 03/27/2013  Indications: Fetal Distress and Dystocia   Pre-operative Diagnosis: Non reassuring fetal heart tracing.   Post-operative Diagnosis: Same   Surgeon: Surgeon(s) and Role:    * Sye Schroepfer A Torey Reinard, MD - Primary   Assistants: none  Anesthesia: epidural  Procedure : STAT LTCS 2 layer closure  Procedure Details:  The patient was seen in the Holding Room. The risks, benefits, complications, treatment options, and expected outcomes were discussed with the patient. The patient concurred with the proposed plan, giving informed consent. identified as Francene Castle and the procedure verified as C-Section Delivery. A Time Out was held and the above information confirmed.  After induction of anesthesia, the patient was draped and prepped in the usual sterile manner. A transverse incision was made and carried down through the subcutaneous tissue to the fascia. Fascial incision was made in the midline and extended transversely. The fascia was separated from the underlying rectus muscle superiorly and inferiorly. The peritoneum was identified and entered. Peritoneal incision was extended longitudinally with good visualization of bowel and bladder. The utero-vesical peritoneal reflection was incised transversely and the bladder flap was bluntly freed from the lower uterine segment.  An alexsis retractor was placed in the abdomen.   A low transverse uterine incision was made. Delivered from cephalic presentation was a  infant, with Apgar scores of 9 at one minute and 9 at five minutes. Cord ph was arterial 7.35, venous 7.34 the umbilical cord was clamped and cut cord blood was obtained for evaluation. The placenta was removed Intact and appeared normal. The uterine outline, tubes and ovaries appeared normal}. The uterine incision was closed with running locked sutures of 0Vicryl. A second layer 0 vicrlyl was used to imbricate the uterine  incision    Hemostasis was observed. Lavage was carried out until clear. Surgicel was placed along the uterine incision The alexsis was removed.  The peritoneum was closed with 0 chromic.  The muscles were examined and any bleeders were made hemostatic using bovie cautery device.   The fascia was then reapproximated with running sutures of 0 vicryl.  The subcutaneous tissue was reapproximated  With interrupted stitches using 2-0 plain gut. The subcuticular closure was performed using 3-22monocryl     Instrument, sponge, and needle counts were correct prior the abdominal closure and were correct at the conclusion of the case.    Findings: infant was delivered from vtx presentation. The fluid was sig for thick particulate meconiuim.  The uterus tubes and ovaries appeared normal.     Blood Loss:   Total IV Fluids:   Urine Output: 600CC OF clear urine  Specimens: placenta to pathology  Complications: no complications  Disposition: PACU - hemodynamically stable.   Maternal Condition: stable   Baby condition / location:  nursery-stable  Attending Attestation: I was present and scrubbed for the entire procedure. Verbal consent was given by the pt because it was a stat cesarean  Signed: Surgeon(s): Michael Litter, MD

## 2013-03-27 NOTE — Anesthesia Postprocedure Evaluation (Signed)
  Anesthesia Post-op Note  Anesthesia Post Note  Patient: Cathy Elliott  Procedure(s) Performed: Procedure(s) (LRB): CESAREAN SECTION (N/A)  Anesthesia type: Epidural  Patient location: PACU  Post pain: Pain level controlled  Post assessment: Post-op Vital signs reviewed  Last Vitals:  Filed Vitals:   03/27/13 1600  BP: 136/83  Pulse: 93  Temp:   Resp: 20    Post vital signs: stable  Level of consciousness: awake  Complications: No apparent anesthesia complications

## 2013-03-27 NOTE — Lactation Note (Signed)
This note was copied from the chart of Cathy Elliott. Lactation Consultation Note  Patient Name: Cathy Elliott [id number] Date: 03/27/2013 Reason for consult: Initial assessment;Other (Comment) (charting for exclusion)   Maternal Data Formula Feeding for Exclusion: Yes Reason for exclusion: Mother's choice to forumla feed on admision  Feeding Feeding Type: Bottle Fed - Formula  LATCH Score/Interventions                      Lactation Tools Discussed/Used     Consult Status Consult Status: Complete    Lynda Rainwater 03/27/2013, 3:50 PM

## 2013-03-27 NOTE — Anesthesia Procedure Notes (Signed)
Epidural Patient location during procedure: OB Start time: 03/27/2013 9:33 AM  Staffing Anesthesiologist: Angus Seller., Harrell Gave. Performed by: anesthesiologist   Preanesthetic Checklist Completed: patient identified, site marked, surgical consent, pre-op evaluation, timeout performed, IV checked, risks and benefits discussed and monitors and equipment checked  Epidural Patient position: sitting Prep: site prepped and draped and DuraPrep Patient monitoring: continuous pulse ox and blood pressure Approach: midline Injection technique: LOR air  Needle:  Needle type: Tuohy  Needle gauge: 17 G Needle length: 9 cm and 9 Needle insertion depth: 7 cm Catheter type: closed end flexible Catheter size: 19 Gauge Catheter at skin depth: 12 cm Test dose: negative  Assessment Events: blood not aspirated, injection not painful, no injection resistance, negative IV test and no paresthesia  Additional Notes Patient identified.  Risk benefits discussed including failed block, incomplete pain control, headache, nerve damage, paralysis, blood pressure changes, nausea, vomiting, reactions to medication both toxic or allergic, and postpartum back pain.  Patient expressed understanding and wished to proceed.  All questions were answered.  Sterile technique used throughout procedure and epidural site dressed with sterile barrier dressing. No paresthesia or other complications noted.The patient did not experience any signs of intravascular injection such as tinnitus or metallic taste in mouth nor signs of intrathecal spread such as rapid motor block. Please see nursing notes for vital signs.

## 2013-03-27 NOTE — Progress Notes (Signed)
  Subjective: Monitoring FHT.  BPs continue to be slightly elevated.  Objective: BP 140/83  Pulse 85  Temp(Src) 98.1 F (36.7 C) (Oral)  Resp 18  Ht 5\' 1"  (1.549 m)  Wt 178 lb (80.74 kg)  BMI 33.65 kg/m2  SpO2 100%  LMP 06/08/2012     Filed Vitals:   03/26/13 2147 03/26/13 2221 03/26/13 2319 03/27/13 0012  BP: 129/84 132/81 147/88 140/83  Pulse: 73 84 74 85  Temp: 98 F (36.7 C)  98.1 F (36.7 C)   TempSrc: Oral  Oral   Resp: 20 20 20 18   Height:      Weight:      SpO2:       FHT:  Cat II with occasional variables UC:   regular, every 2-4 minutes  SVE:   Dilation: 4 Effacement (%): 70 Station: -2 Exam by:: e. poore, rn  Assessment / Plan:  Labor: IOL for PD; Pitocin at 3 milliU; MVUs 150 - 160  Preeclampsia: BPs noted above, in light of decel and meconium stained fluid will collect PIH labs; no s/s  Fetal Wellbeing: Cat II Pain Control: Breathing and relaxation  I/D: GBS pos; PCN prophylaxis; ROM x 4 hours; afibrile  Anticipated MOD: SVD vs c/s    Blaine Guiffre 03/27/2013, 12:38 AM

## 2013-03-27 NOTE — Progress Notes (Signed)
  Subjective: Pt at adequate MVUs.  PIH labs pending.  Objective: BP 131/80  Pulse 86  Temp(Src) 98 F (36.7 C) (Oral)  Resp 18  Ht 5\' 1"  (1.549 m)  Wt 178 lb (80.74 kg)  BMI 33.65 kg/m2  SpO2 100%  LMP 06/08/2012     Filed Vitals:   03/27/13 0012 03/27/13 0118 03/27/13 0217 03/27/13 0256  BP: 140/83 144/95 142/93 131/80  Pulse: 85 79 85 86  Temp:  98 F (36.7 C)    TempSrc:  Oral    Resp: 18 18 18 18   Height:      Weight:      SpO2:       FHT:  Cat I UC:   regular, every 2-3 minutes  SVE:   Dilation: 4.5 Effacement (%): 70 Station: -2 Exam by:: e. poore, rn  Assessment / Plan:  Labor: IOL for PD; Pitocin at 6 milliU; MVUs 175 - 205  Preeclampsia: BPs noted above; PIH labs pending; no s/s  Fetal Wellbeing: Cat I Pain Control: Breathing and relaxation  I/D: GBS pos; PCN prophylaxis; ROM x 7 hours; afibrile  Anticipated MOD: SVD   Cathy Elliott 03/27/2013, 3:33 AM

## 2013-03-27 NOTE — Progress Notes (Signed)
Cathy Elliott is a 22 y.o. G2P0010 at [redacted]w[redacted]d by LMP admitted for induction of labor due to Post dates. Due date one week ago.  Subjective: Pt comfortable with epidural  Objective: BP 135/77  Pulse 96  Temp(Src) 98.7 F (37.1 C) (Oral)  Resp 20  Ht 5\' 1"  (1.549 m)  Wt 178 lb (80.74 kg)  BMI 33.65 kg/m2  SpO2 99%  LMP 06/08/2012 I/O last 3 completed shifts: In: 150 [P.O.:150] Out: 600 [Urine:600] Total I/O In: 1684.8 [P.O.:620; I.V.:1064.8] Out: 1050 [Urine:1050]  FHT:  FHR: 130 bpm, variability: moderate,  accelerations:  Present,  decelerations:  Present moderate variables with good recovery UC:   regular, every 2-6 minutes SVE:   Dilation: 5 Effacement (%): 80 Station: -2 Exam by:: e. poore, rn  Labs: Lab Results  Component Value Date   WBC 21.4* 03/27/2013   HGB 9.7* 03/27/2013   HCT 29.4* 03/27/2013   MCV 67.0* 03/27/2013   PLT 170 03/27/2013    Assessment / Plan: Augmenting labor with pitocin.  pt is not yet adequate  Labor: Will recheck cervix after 2 hours once adequate on pitocin Preeclampsia:  on magnesium sulfate Fetal Wellbeing:  Category II Pain Control:  Epidural I/D:  n/a Anticipated MOD:  Hope for SVD.  variables have improved with amnioinfusion.  pt on magnesium.  BPS stable with adequate urine output.  watch closely  Cathy Elliott A 03/27/2013, 12:53 PM

## 2013-03-28 ENCOUNTER — Encounter (HOSPITAL_COMMUNITY): Payer: Self-pay | Admitting: Obstetrics and Gynecology

## 2013-03-28 DIAGNOSIS — IMO0002 Reserved for concepts with insufficient information to code with codable children: Secondary | ICD-10-CM | POA: Diagnosis present

## 2013-03-28 LAB — COMPREHENSIVE METABOLIC PANEL
ALT: 9 U/L (ref 0–35)
AST: 22 U/L (ref 0–37)
Alkaline Phosphatase: 106 U/L (ref 39–117)
Calcium: 6.7 mg/dL — ABNORMAL LOW (ref 8.4–10.5)
Chloride: 101 mEq/L (ref 96–112)
GFR calc Af Amer: >90 mL/min (ref >90)
Glucose, Bld: 103 mg/dL — ABNORMAL HIGH (ref 70–99)
Potassium: 4.2 mEq/L (ref 3.5–5.1)
Sodium: 134 mEq/L — ABNORMAL LOW (ref 135–145)
Total Protein: 4.8 g/dL — ABNORMAL LOW (ref 6.0–8.3)

## 2013-03-28 LAB — CBC
HCT: 23.6 % — ABNORMAL LOW (ref 36.0–46.0)
Hemoglobin: 7.7 g/dL — ABNORMAL LOW (ref 12.0–15.0)
MCHC: 32.6 g/dL (ref 30.0–36.0)
MCV: 67.6 fL — ABNORMAL LOW (ref 78.0–100.0)
RBC: 3.49 MIL/uL — ABNORMAL LOW (ref 3.87–5.11)
RDW: 14.9 % (ref 11.5–15.5)
WBC: 19.3 10*3/uL — ABNORMAL HIGH (ref 4.0–10.5)

## 2013-03-28 NOTE — Anesthesia Postprocedure Evaluation (Signed)
  Anesthesia Post-op Note  Anesthesia Post Note  Patient: Cathy Elliott  Procedure(s) Performed: Procedure(s) (LRB): CESAREAN SECTION (N/A)  Anesthesia type: Epidural  Patient location: Mother/Baby  Post pain: Pain level controlled  Post assessment: Post-op Vital signs reviewed  Last Vitals:  Filed Vitals:   03/28/13 0700  BP: 101/47  Pulse:   Temp:   Resp:     Post vital signs: Reviewed  Level of consciousness:alert  Complications: No apparent anesthesia complications

## 2013-03-28 NOTE — Progress Notes (Signed)
Subjective: Postpartum Day 1: Cesarean Delivery Patient reports no nausea, vomiting, incisional pain. Patient is tolerating PO, + flatus and + BM.    Objective: Vital signs in last 24 hours: Temp:  [98.2 F (36.8 C)-98.7 F (37.1 C)] 98.6 F (37 C) (10/23 1800) Pulse Rate:  [83-111] 83 (10/23 1800) Resp:  [16-20] 20 (10/23 1800) BP: (91-146)/(38-88) 109/70 mmHg (10/23 1800) SpO2:  [98 %-100 %] 99 % (10/23 1800) Vital signs stable, without orthostatic changes Physical Exam:  General: alert, cooperative, appears stated age and no distress Lochia: appropriate Uterine Fundus: firm Incision: Dressing dry DVT Evaluation: No evidence of DVT seen on physical exam.   Recent Labs  03/27/13 1508 03/28/13 0525  HGB 10.2* 7.7*  HCT 31.4* 23.6*    Assessment/Plan: Status post Cesarean section. Doing well postoperatively.  Preeclampsia, stable Acute blood loss anemia, patient hemodynamically stable  Recommendations: Magnesium sulfate to be discontinued at 1300 and if the patient remains stable. Transfer to mother-baby unit. Antibiotics for chorioamnionitis discontinued and the patient remains afebrile. Oral iron sulfate  Jax Abdelrahman P 03/28/2013, 6:19 PM

## 2013-03-29 LAB — COMPREHENSIVE METABOLIC PANEL
ALT: 11 U/L (ref 0–35)
AST: 26 U/L (ref 0–37)
Albumin: 2.2 g/dL — ABNORMAL LOW (ref 3.5–5.2)
Alkaline Phosphatase: 112 U/L (ref 39–117)
CO2: 26 mEq/L (ref 19–32)
Calcium: 8.1 mg/dL — ABNORMAL LOW (ref 8.4–10.5)
Chloride: 104 mEq/L (ref 96–112)
GFR calc Af Amer: >90 mL/min (ref >90)
GFR calc non Af Amer: >90 mL/min (ref >90)
Glucose, Bld: 80 mg/dL (ref 70–99)
Potassium: 4.1 mEq/L (ref 3.5–5.1)
Sodium: 137 mEq/L (ref 135–145)

## 2013-03-29 NOTE — Progress Notes (Signed)
Subjective: Postpartum Day 2: Cesarean Delivery due to fetal distress and dystocia Patient up ad lib, reports no syncope or dizziness. Feeding:  Bottle Contraceptive plan:  Depo, discussed Mirena and pt may consider  Objective: Vital signs in last 24 hours: Temp:  [97.3 F (36.3 C)-98.7 F (37.1 C)] 97.3 F (36.3 C) (10/24 0626) Pulse Rate:  [69-102] 79 (10/24 0626) Resp:  [16-20] 18 (10/24 0626) BP: (101-121)/(53-80) 101/59 mmHg (10/24 0626) SpO2:  [98 %-100 %] 100 % (10/24 1478)  Physical Exam:  General: alert, cooperative and no distress Lochia: appropriate Uterine Fundus: firm Incision: healing well DVT Evaluation: No evidence of DVT seen on physical exam. Negative Homan's sign. JP drain:   n/a   Recent Labs  03/27/13 1508 03/28/13 0525  HGB 10.2* 7.7*  HCT 31.4* 23.6*    Assessment/Plan: Status post Cesarean section day 1. BPs stable Anemia - Currently on Fe  Doing well postoperatively.  Continue current care. Plan for discharge tomorrow    Haroldine Laws 03/29/2013, 8:00 AM

## 2013-03-29 NOTE — Lactation Note (Signed)
This note was copied from the chart of Cathy Vearl Aitken. Lactation Consultation Note  Patient Name: Cathy Elliott [id number] Date: 03/29/2013 Reason for consult: Follow-up assessment Mom has decided she wants to try to breastfeed. RN assisted Mom with latching baby prior to last bottle, but Mom reports baby became fussy so she gave a bottle. BF basics reviewed with Mom. Reviewed importance of baby being at the breast with every feeding 8-12 times in 24 hours or more. Encouraged Mom to call with the next feeding for [facility] assistance with latch. Hand expression demonstrated to Mom, colostrum present. Encouraged massage and hand expression prior to trying to latch baby. Lactation brochure left for review. Advised of OP services and support group. Mom to call.  Maternal Data    Feeding Feeding Type: Formula  LATCH Score/Interventions                      Lactation Tools Discussed/Used     Consult Status Consult Status: Follow-up Date: 03/29/13 Follow-up type: In-patient    [name] 03/29/2013, 11:34 AM

## 2013-03-30 LAB — COMPREHENSIVE METABOLIC PANEL
AST: 29 U/L (ref 0–37)
BUN: 11 mg/dL (ref 6–23)
Calcium: 8.5 mg/dL (ref 8.4–10.5)
Chloride: 105 mEq/L (ref 96–112)
Creatinine, Ser: 0.7 mg/dL (ref 0.50–1.10)
GFR calc Af Amer: >90 mL/min (ref >90)
Glucose, Bld: 82 mg/dL (ref 70–99)
Total Protein: 6 g/dL (ref 6.0–8.3)

## 2013-03-30 LAB — URIC ACID: Uric Acid, Serum: 5.7 mg/dL (ref 2.4–7.0)

## 2013-03-30 MED ORDER — MEDROXYPROGESTERONE ACETATE 150 MG/ML IM SUSP
150.0000 mg | Freq: Once | INTRAMUSCULAR | Status: AC
  Administered 2013-03-30: 150 mg via INTRAMUSCULAR
  Filled 2013-03-30: qty 1

## 2013-03-30 MED ORDER — OXYCODONE-ACETAMINOPHEN 5-325 MG PO TABS: 1.0000 | ORAL_TABLET | ORAL | Status: DC | PRN

## 2013-03-30 MED ORDER — IBUPROFEN 600 MG PO TABS: 600.0000 mg | ORAL_TABLET | Freq: Four times a day (QID) | ORAL | Status: DC | PRN

## 2013-03-30 NOTE — Progress Notes (Signed)
PROGRESS NOTE  I have reviewed the patient's vital signs, labs, and notes. I agree with the previous note from the Certified Nurse Midwife.  Yidel Teuscher [place] Mikaela Hilgeman, M.D. 03/30/2013  

## 2013-03-30 NOTE — Discharge Summary (Signed)
  Cesarean Section Delivery Discharge Summary  Cathy Elliott  DOB:    02/26/1991 MRN:    [id number] CSN:    [id number]  Date of admission:                  03/26/13  Date of discharge:                   03/30/13  Procedures this admission:  Date of Delivery: 03/27/13  Newborn Data:  Live born female  Birth Weight: 7 lb 13.4 oz (3555 g) APGAR: 9, 9  Home with mother. Name: Cathy Elliott Circumcision Plan: Outpatient  History of Present Illness:  [name] is a 22 y.o. female, G2P1011, who presents at [redacted]w[redacted]d weeks gestation. The patient has been followed at the [facility] and Gynecology division of [facility].    Her pregnancy has been complicated by:  Patient Active Problem List   Diagnosis Date Noted  . Status post primary low transverse cesarean section 03/28/2013  . Mild or unspecified pre-eclampsia, with delivery 03/28/2013  . Smoker 03/26/2013  . Group B Streptococcus carrier, antepartum 03/26/2013  . Post-dates pregnancy 03/26/2013    Hospital course:  The patient was admitted for induction of labor at 41 1/7 weeks.  She had pitocin initiated due to a favorable cervix.  She had a singe prolonged decel on the evening of 03/26/13, with resolution.  She had elevated BPs noted, with an elevated protein/creatnine ratio.  Magnesium was started during labor.  She progressed to 5 cm, then had an episode of bradycardia that did not resolve with the usual interventions.  She was taking emergently to the OR for STAT primary cesarean delivery by Dr. Normand Sloop.   Her postpartum course was remarkable for 24 hours of magnesium sulfate after delivery, with improvement of BP during that time.  She had anemia noted, but had no hemodynamic instability  She declined transfusion.  She desired Depo Provera prior to d/c.  She was discharged to home on postpartum day 3 doing well.  Feeding:  breast  Contraception:  Depo-Provera  Discharge  hemoglobin:  Hemoglobin  Date Value Range Status  03/28/2013 7.7* 12.0 - 15.0 g/dL Final     DELTA CHECK NOTED     REPEATED TO VERIFY     HCT  Date Value Range Status  03/28/2013 23.6* 36.0 - 46.0 % Final    Discharge Physical Exam:   General: alert Lochia: appropriate Uterine Fundus: firm Incision: Honeycomb dressing intact DVT Evaluation: No evidence of DVT seen on physical exam. Negative Homan's sign.  Intrapartum Procedures: cesarean: low cervical, transverse--NRFFHR Postpartum Procedures: magnesium sulfate x 24 hours post-delivery. Complications-Operative and Postpartum: Anemia, stable  Discharge Diagnoses: Term Pregnancy-delivered, Preelampsia and anemia  Discharge Information:  Activity:           Per [facility] handout Diet:                routine Medications: Ibuprofen, Percocet and FE--Depo Provera at d/c, with plan for Rx after 6 week pp visit. Condition:      stable Instructions:  Discharge to: home     Nigel Bridgeman 03/30/2013

## 2013-04-11 ENCOUNTER — Other Ambulatory Visit: Payer: Self-pay

## 2013-07-05 ENCOUNTER — Inpatient Hospital Stay (HOSPITAL_COMMUNITY)
Admission: AD | Admit: 2013-07-05 | Discharge: 2013-07-05 | Disposition: A | Discharge status: Home / Self Care | Payer: Medicaid Other | Source: Ambulatory Visit | Attending: Obstetrics and Gynecology | Admitting: Obstetrics and Gynecology

## 2013-07-05 ENCOUNTER — Encounter (HOSPITAL_COMMUNITY): Payer: Self-pay

## 2013-07-05 DIAGNOSIS — H109 Unspecified conjunctivitis: Secondary | ICD-10-CM | POA: Insufficient documentation

## 2013-07-05 DIAGNOSIS — A499 Bacterial infection, unspecified: Secondary | ICD-10-CM

## 2013-07-05 DIAGNOSIS — Z87891 Personal history of nicotine dependence: Secondary | ICD-10-CM | POA: Insufficient documentation

## 2013-07-05 DIAGNOSIS — B9689 Other specified bacterial agents as the cause of diseases classified elsewhere: Secondary | ICD-10-CM

## 2013-07-05 DIAGNOSIS — H1089 Other conjunctivitis: Secondary | ICD-10-CM

## 2013-07-05 DIAGNOSIS — Z3202 Encounter for pregnancy test, result negative: Secondary | ICD-10-CM | POA: Insufficient documentation

## 2013-07-05 LAB — POCT PREGNANCY, URINE: Preg Test, Ur: NEGATIVE

## 2013-07-05 MED ORDER — POLYMYXIN B-TRIMETHOPRIM 10000-0.1 UNIT/ML-% OP SOLN: 2.0000 [drp] | OPHTHALMIC | Status: DC

## 2013-07-05 NOTE — Progress Notes (Signed)
JOxley, CNM in delivery, will come see pt as soon as possible.

## 2013-07-05 NOTE — Discharge Instructions (Signed)
Bacterial Conjunctivitis  Bacterial conjunctivitis, commonly called pink eye, is an inflammation of the clear membrane that covers the white part of the eye (conjunctiva). The inflammation can also happen on the underside of the eyelids. The blood vessels in the conjunctiva become inflamed causing the eye to become red or pink. Bacterial conjunctivitis may spread easily from one eye to another and from person to person (contagious).   CAUSES   Bacterial conjunctivitis is caused by bacteria. The bacteria may come from your own skin, your upper respiratory tract, or from someone else with bacterial conjunctivitis.  SYMPTOMS   The normally white color of the eye or the underside of the eyelid is usually pink or red. The pink eye is usually associated with irritation, tearing, and some sensitivity to light. Bacterial conjunctivitis is often associated with a thick, yellowish discharge from the eye. The discharge may turn into a crust on the eyelids overnight, which causes your eyelids to stick together. If a discharge is present, there may also be some blurred vision in the affected eye.  DIAGNOSIS   Bacterial conjunctivitis is diagnosed by your caregiver through an eye exam and the symptoms that you report. Your caregiver looks for changes in the surface tissues of your eyes, which may point to the specific type of conjunctivitis. A sample of any discharge may be collected on a cotton-tip swab if you have a severe case of conjunctivitis, if your cornea is affected, or if you keep getting repeat infections that do not respond to treatment. The sample will be sent to a lab to see if the inflammation is caused by a bacterial infection and to see if the infection will respond to antibiotic medicines.  TREATMENT   · Bacterial conjunctivitis is treated with antibiotics. Antibiotic eyedrops are most often used. However, antibiotic ointments are also available. Antibiotics pills are sometimes used. Artificial tears or eye  washes may ease discomfort.  HOME CARE INSTRUCTIONS   · To ease discomfort, apply a cool, clean wash cloth to your eye for 10 20 minutes, 3 4 times a day.  · Gently wipe away any drainage from your eye with a warm, wet washcloth or a cotton ball.  · Wash your hands often with soap and water. Use paper towels to dry your hands.  · Do not share towels or wash cloths. This may spread the infection.  · Change or wash your pillow case every day.  · You should not use eye makeup until the infection is gone.  · Do not operate machinery or drive if your vision is blurred.  · Stop using contacts lenses. Ask your caregiver how to sterilize or replace your contacts before using them again. This depends on the type of contact lenses that you use.  · When applying medicine to the infected eye, do not touch the edge of your eyelid with the eyedrop bottle or ointment tube.  SEEK IMMEDIATE MEDICAL CARE IF:   · Your infection has not improved within 3 days after beginning treatment.  · You had yellow discharge from your eye and it returns.  · You have increased eye pain.  · Your eye redness is spreading.  · Your vision becomes blurred.  · You have a fever or persistent symptoms for more than 2 3 days.  · You have a fever and your symptoms suddenly get worse.  · You have facial pain, redness, or swelling.  MAKE SURE YOU:   · Understand these instructions.  · Will watch your   condition.  · Will get help right away if you are not doing well or get worse.  Document Released: 05/23/2005 Document Revised: 02/15/2012 Document Reviewed: 10/24/2011  ExitCare® Patient Information ©2014 [employer].

## 2013-07-05 NOTE — MAU Note (Signed)
Pt states here for red eye with drainage x3 days. Also had emergent c/s 03/27/2013, Depo injection rcvd 10/26, was to get next injection 06/11/2013, however wanted implanon instead. Has not had menstrual cycle since mid-December. Unsure of pregnancy.
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
## 2013-07-07 ENCOUNTER — Emergency Department (HOSPITAL_COMMUNITY)
Admission: EM | Admit: 2013-07-07 | Discharge: 2013-07-07 | Disposition: A | Discharge status: Home / Self Care | Payer: Medicaid Other | Attending: Emergency Medicine | Admitting: Emergency Medicine

## 2013-07-07 ENCOUNTER — Encounter (HOSPITAL_COMMUNITY): Payer: Self-pay | Admitting: Emergency Medicine

## 2013-07-07 ENCOUNTER — Emergency Department (HOSPITAL_COMMUNITY): Payer: Medicaid Other

## 2013-07-07 DIAGNOSIS — S0990XA Unspecified injury of head, initial encounter: Secondary | ICD-10-CM | POA: Insufficient documentation

## 2013-07-07 DIAGNOSIS — Z8619 Personal history of other infectious and parasitic diseases: Secondary | ICD-10-CM | POA: Insufficient documentation

## 2013-07-07 DIAGNOSIS — S0083XA Contusion of other part of head, initial encounter: Secondary | ICD-10-CM

## 2013-07-07 DIAGNOSIS — IMO0002 Reserved for concepts with insufficient information to code with codable children: Secondary | ICD-10-CM

## 2013-07-07 DIAGNOSIS — Z87891 Personal history of nicotine dependence: Secondary | ICD-10-CM | POA: Insufficient documentation

## 2013-07-07 DIAGNOSIS — H1131 Conjunctival hemorrhage, right eye: Secondary | ICD-10-CM

## 2013-07-07 DIAGNOSIS — S0003XA Contusion of scalp, initial encounter: Secondary | ICD-10-CM | POA: Insufficient documentation

## 2013-07-07 DIAGNOSIS — Z87440 Personal history of urinary (tract) infections: Secondary | ICD-10-CM | POA: Insufficient documentation

## 2013-07-07 DIAGNOSIS — Z792 Long term (current) use of antibiotics: Secondary | ICD-10-CM | POA: Insufficient documentation

## 2013-07-07 DIAGNOSIS — H1130 Conjunctival hemorrhage, unspecified eye: Secondary | ICD-10-CM | POA: Insufficient documentation

## 2013-07-07 DIAGNOSIS — S1093XA Contusion of unspecified part of neck, initial encounter: Secondary | ICD-10-CM

## 2013-07-07 MED ORDER — TETRACAINE HCL 0.5 % OP SOLN
1.0000 [drp] | Freq: Once | OPHTHALMIC | Status: DC
  Filled 2013-07-07: qty 2

## 2013-07-07 MED ORDER — IBUPROFEN 800 MG PO TABS: 800.0000 mg | ORAL_TABLET | Freq: Three times a day (TID) | ORAL | Status: DC

## 2013-07-07 MED ORDER — FLUORESCEIN SODIUM 1 MG OP STRP
1.0000 | ORAL_STRIP | Freq: Once | OPHTHALMIC | Status: DC
  Filled 2013-07-07: qty 1

## 2013-07-07 NOTE — ED Provider Notes (Signed)
Medical screening examination/treatment/procedure(s) were performed by non-physician practitioner and as supervising physician I was immediately available for consultation/collaboration.  EKG Interpretation   None         Charles B. Sheldon, MD 07/07/13 2321 
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
## 2013-07-07 NOTE — Discharge Instructions (Signed)
You have been evaluated for your facial injury.  Please keep an ice pack to affected area to help decrease swelling.  Take ibuprofen for pain.  Continue to use eyedrop to prevent infection.  Follow up with eye specialist as needed.  Return if you have any concerns.    Facial or Scalp Contusion A facial or scalp contusion is a deep bruise on the face or head. Injuries to the face and head generally cause a lot of swelling, especially around the eyes. Contusions are the result of an injury that caused bleeding under the skin. The contusion may turn blue, purple, or yellow. Minor injuries will give you a painless contusion, but more severe contusions may stay painful and swollen for a few weeks.  CAUSES  A facial or scalp contusion is caused by a blunt injury or trauma to the face or head area.  SIGNS AND SYMPTOMS   Swelling of the injured area.   Discoloration of the injured area.   Tenderness, soreness, or pain in the injured area.  DIAGNOSIS  The diagnosis can be made by taking a medical history and doing a physical exam. An X-ray exam, CT scan, or MRI may be needed to determine if there are any associated injuries, such as broken bones (fractures). TREATMENT  Often, the best treatment for a facial or scalp contusion is applying cold compresses to the injured area. Over-the-counter medicines may also be recommended for pain control.  HOME CARE INSTRUCTIONS   Only take over-the-counter or prescription medicines as directed by your health care provider.   Apply ice to the injured area.   Put ice in a plastic bag.   Place a towel between your skin and the bag.   Leave the ice on for 20 minutes, 2 3 times a day.  SEEK MEDICAL CARE IF:  You have bite problems.   You have pain with chewing.   You are concerned about facial defects. SEEK IMMEDIATE MEDICAL CARE IF:  You have severe pain or a headache that is not relieved by medicine.   You have unusual sleepiness, confusion,  or personality changes.   You throw up (vomit).   You have a persistent nosebleed.   You have double vision or blurred vision.   You have fluid drainage from your nose or ear.   You have difficulty walking or using your arms or legs.  MAKE SURE YOU:   Understand these instructions.  Will watch your condition.  Will get help right away if you are not doing well or get worse. Document Released: 06/30/2004 Document Revised: 03/13/2013 Document Reviewed: 01/03/2013 [facility] Patient Information 2014 [employer] [place].

## 2013-09-01 ENCOUNTER — Encounter (HOSPITAL_COMMUNITY): Payer: Self-pay | Admitting: Emergency Medicine

## 2013-09-01 ENCOUNTER — Emergency Department (HOSPITAL_COMMUNITY)
Admission: EM | Admit: 2013-09-01 | Discharge: 2013-09-01 | Disposition: A | Discharge status: Home / Self Care | Payer: Medicaid Other | Attending: Emergency Medicine | Admitting: Emergency Medicine

## 2013-09-01 ENCOUNTER — Emergency Department (HOSPITAL_COMMUNITY): Payer: Medicaid Other

## 2013-09-01 DIAGNOSIS — Z3202 Encounter for pregnancy test, result negative: Secondary | ICD-10-CM | POA: Insufficient documentation

## 2013-09-01 DIAGNOSIS — Z8619 Personal history of other infectious and parasitic diseases: Secondary | ICD-10-CM | POA: Insufficient documentation

## 2013-09-01 DIAGNOSIS — R112 Nausea with vomiting, unspecified: Secondary | ICD-10-CM | POA: Insufficient documentation

## 2013-09-01 DIAGNOSIS — Z87440 Personal history of urinary (tract) infections: Secondary | ICD-10-CM | POA: Insufficient documentation

## 2013-09-01 DIAGNOSIS — Z87891 Personal history of nicotine dependence: Secondary | ICD-10-CM | POA: Insufficient documentation

## 2013-09-01 DIAGNOSIS — R42 Dizziness and giddiness: Secondary | ICD-10-CM | POA: Insufficient documentation

## 2013-09-01 LAB — PREGNANCY, URINE: Preg Test, Ur: NEGATIVE

## 2013-09-01 MED ORDER — ONDANSETRON HCL 4 MG/2ML IJ SOLN
4.0000 mg | Freq: Once | INTRAMUSCULAR | Status: AC
  Administered 2013-09-01: 4 mg via INTRAVENOUS
  Filled 2013-09-01: qty 2

## 2013-09-01 MED ORDER — DIPHENHYDRAMINE HCL 50 MG/ML IJ SOLN
25.0000 mg | Freq: Once | INTRAMUSCULAR | Status: AC
  Administered 2013-09-01: 25 mg via INTRAVENOUS
  Filled 2013-09-01: qty 1

## 2013-09-01 MED ORDER — SODIUM CHLORIDE 0.9 % IV BOLUS (SEPSIS)
1000.0000 mL | Freq: Once | INTRAVENOUS | Status: AC
  Administered 2013-09-01: 1000 mL via INTRAVENOUS

## 2013-09-01 MED ORDER — ONDANSETRON 4 MG PO TBDP: 4.0000 mg | ORAL_TABLET | Freq: Three times a day (TID) | ORAL | Status: DC | PRN

## 2013-09-01 MED ORDER — MECLIZINE HCL 25 MG PO TABS
25.0000 mg | ORAL_TABLET | Freq: Once | ORAL | Status: AC
  Administered 2013-09-01: 25 mg via ORAL
  Filled 2013-09-01: qty 1

## 2013-09-01 MED ORDER — MECLIZINE HCL 50 MG PO TABS: 50.0000 mg | ORAL_TABLET | Freq: Three times a day (TID) | ORAL | Status: DC | PRN

## 2013-09-01 NOTE — Discharge Instructions (Signed)

## 2013-09-01 NOTE — ED Notes (Addendum)
Pt in c/o headache x2 days and then developed vomiting and has been dizzy when standing today, states symptoms are getting worse, photophobia, no history of migraines-pt states she has had headaches before but none that have lasted this long or been this severe, denies hitting head or LOC, states she is unable to keep any food or drink down
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
## 2014-04-04 ENCOUNTER — Emergency Department (INDEPENDENT_AMBULATORY_CARE_PROVIDER_SITE_OTHER)
Admission: EM | Admit: 2014-04-04 | Discharge: 2014-04-04 | Disposition: A | Discharge status: Home / Self Care | Payer: Self-pay | Source: Home / Self Care

## 2014-04-04 ENCOUNTER — Encounter (HOSPITAL_COMMUNITY): Payer: Self-pay | Admitting: *Deleted

## 2014-04-04 ENCOUNTER — Encounter (HOSPITAL_COMMUNITY): Payer: Self-pay | Admitting: Emergency Medicine

## 2014-04-04 ENCOUNTER — Inpatient Hospital Stay (HOSPITAL_COMMUNITY): Payer: Self-pay

## 2014-04-04 ENCOUNTER — Inpatient Hospital Stay (HOSPITAL_COMMUNITY)
Admission: AD | Admit: 2014-04-04 | Discharge: 2014-04-04 | Disposition: A | Discharge status: Home / Self Care | Payer: Self-pay | Source: Ambulatory Visit | Attending: Family Medicine | Admitting: Family Medicine

## 2014-04-04 ENCOUNTER — Other Ambulatory Visit (HOSPITAL_COMMUNITY)
Admission: RE | Admit: 2014-04-04 | Discharge: 2014-04-04 | Disposition: A | Discharge status: Home / Self Care | Payer: Self-pay | Source: Ambulatory Visit | Attending: Emergency Medicine | Admitting: Emergency Medicine

## 2014-04-04 DIAGNOSIS — R1013 Epigastric pain: Secondary | ICD-10-CM

## 2014-04-04 DIAGNOSIS — Z113 Encounter for screening for infections with a predominantly sexual mode of transmission: Secondary | ICD-10-CM | POA: Insufficient documentation

## 2014-04-04 DIAGNOSIS — B9689 Other specified bacterial agents as the cause of diseases classified elsewhere: Secondary | ICD-10-CM

## 2014-04-04 DIAGNOSIS — N898 Other specified noninflammatory disorders of vagina: Secondary | ICD-10-CM

## 2014-04-04 DIAGNOSIS — R52 Pain, unspecified: Secondary | ICD-10-CM

## 2014-04-04 DIAGNOSIS — O26899 Other specified pregnancy related conditions, unspecified trimester: Secondary | ICD-10-CM

## 2014-04-04 DIAGNOSIS — Z720 Tobacco use: Secondary | ICD-10-CM | POA: Insufficient documentation

## 2014-04-04 DIAGNOSIS — R109 Unspecified abdominal pain: Secondary | ICD-10-CM | POA: Insufficient documentation

## 2014-04-04 DIAGNOSIS — Z3491 Encounter for supervision of normal pregnancy, unspecified, first trimester: Secondary | ICD-10-CM

## 2014-04-04 DIAGNOSIS — O26891 Other specified pregnancy related conditions, first trimester: Secondary | ICD-10-CM

## 2014-04-04 DIAGNOSIS — Z3201 Encounter for pregnancy test, result positive: Secondary | ICD-10-CM

## 2014-04-04 DIAGNOSIS — N760 Acute vaginitis: Secondary | ICD-10-CM | POA: Insufficient documentation

## 2014-04-04 DIAGNOSIS — R102 Pelvic and perineal pain: Secondary | ICD-10-CM

## 2014-04-04 HISTORY — DX: Unspecified ovarian cyst, unspecified side: N83.209

## 2014-04-04 LAB — WET PREP, GENITAL
TRICH WET PREP: NONE SEEN
YEAST WET PREP: NONE SEEN

## 2014-04-04 LAB — POCT PREGNANCY, URINE: Preg Test, Ur: POSITIVE — AB

## 2014-04-04 LAB — CBC WITH DIFFERENTIAL/PLATELET
Basophils Absolute: 0 10*3/uL (ref 0.0–0.1)
Basophils Relative: 0 % (ref 0–1)
EOS PCT: 1 % (ref 0–5)
Eosinophils Absolute: 0.1 10*3/uL (ref 0.0–0.7)
HCT: 31.3 % — ABNORMAL LOW (ref 36.0–46.0)
Hemoglobin: 10.1 g/dL — ABNORMAL LOW (ref 12.0–15.0)
Lymphocytes Relative: 37 % (ref 12–46)
Lymphs Abs: 2.4 10*3/uL (ref 0.7–4.0)
MCH: 22.4 pg — ABNORMAL LOW (ref 26.0–34.0)
MCHC: 32.3 g/dL (ref 30.0–36.0)
MCV: 69.6 fL — ABNORMAL LOW (ref 78.0–100.0)
MONO ABS: 0.7 10*3/uL (ref 0.1–1.0)
MONOS PCT: 11 % (ref 3–12)
NEUTROS PCT: 51 % (ref 43–77)
Neutro Abs: 3.3 10*3/uL (ref 1.7–7.7)
Platelets: 216 10*3/uL (ref 150–400)
RBC: 4.5 MIL/uL (ref 3.87–5.11)
RDW: 14.7 % (ref 11.5–15.5)
WBC: 6.5 10*3/uL (ref 4.0–10.5)

## 2014-04-04 LAB — POCT URINALYSIS DIP (DEVICE)
BILIRUBIN URINE: NEGATIVE
GLUCOSE, UA: NEGATIVE mg/dL
Ketones, ur: NEGATIVE mg/dL
Leukocytes, UA: NEGATIVE
Nitrite: NEGATIVE
PH: 7 (ref 5.0–8.0)
Protein, ur: 30 mg/dL — AB
Specific Gravity, Urine: 1.02 (ref 1.005–1.030)
Urobilinogen, UA: 4 mg/dL — ABNORMAL HIGH (ref 0.0–1.0)

## 2014-04-04 LAB — HCG, QUANTITATIVE, PREGNANCY: hCG, Beta Chain, Quant, S: 63537 m[IU]/mL — ABNORMAL HIGH (ref <5)

## 2014-04-04 MED ORDER — METRONIDAZOLE 500 MG PO TABS: 500.0000 mg | ORAL_TABLET | Freq: Two times a day (BID) | ORAL | Status: DC

## 2014-04-04 NOTE — Discharge Instructions (Signed)
Bacterial Vaginosis °Bacterial vaginosis is an infection of the vagina. It happens when too many of certain germs (bacteria) grow in the vagina. °HOME CARE °· Take your medicine as told by your doctor. °· Finish your medicine even if you start to feel better. °· Do not have sex until you finish your medicine and are better. °· Tell your sex partner that you have an infection. They should see their doctor for treatment. °· Practice safe sex. Use condoms. Have only one sex partner. °GET HELP IF: °· You are not getting better after 3 days of treatment. °· You have more grey fluid (discharge) coming from your vagina than before. °· You have more pain than before. °· You have a fever. °MAKE SURE YOU:  °· Understand these instructions. °· Will watch your condition. °· Will get help right away if you are not doing well or get worse. °Document Released: 03/01/2008 Document Revised: 03/13/2013 Document Reviewed: 01/02/2013 °ExitCare® Patient Information ©2015 [employer]. This information is not intended to replace advice given to you by your health care provider. Make sure you discuss any questions you have with your health care provider. °Abdominal Pain During Pregnancy °Belly (abdominal) pain is common during pregnancy. Most of the time, it is not a serious problem. Other times, it can be a sign that something is wrong with the pregnancy. Always tell your doctor if you have belly pain. °HOME CARE °Monitor your belly pain for any changes. The following actions may help you feel better: °· Do not have sex (intercourse) or put anything in your vagina until you feel better. °· Rest until your pain stops. °· Drink clear fluids if you feel sick to your stomach (nauseous). Do not eat solid food until you feel better. °· Only take medicine as told by your doctor. °· Keep all doctor visits as told. °GET HELP RIGHT AWAY IF:  °· You are bleeding, leaking fluid, or pieces of tissue come out of your vagina. °· You have more pain or  cramping. °· You keep throwing up (vomiting). °· You have pain when you pee (urinate) or have blood in your pee. °· You have a fever. °· You do not feel your baby moving as much. °· You feel very weak or feel like passing out. °· You have trouble breathing, with or without belly pain. °· You have a very bad headache and belly pain. °· You have fluid leaking from your vagina and belly pain. °· You keep having watery poop (diarrhea). °· Your belly pain does not go away after resting, or the pain gets worse. °MAKE SURE YOU:  °· Understand these instructions. °· Will watch your condition. °· Will get help right away if you are not doing well or get worse. °Document Released: 05/11/2009 Document Revised: 01/23/2013 Document Reviewed: 12/20/2012 °ExitCare® Patient Information ©2015 [employer]. This information is not intended to replace advice given to you by your health care provider. Make sure you discuss any questions you have with your health care provider. ° °

## 2014-04-04 NOTE — ED Notes (Addendum)
Patient c/o abdominal pain x 2 weeks. Patient reports she has not had a period in one month and three weeks. Patient denies any discharge, fever, diarrhea. Has felt nauseous. Reports her partner has had mutliple partners and would like to be checked for STDs specifically trichomoniasis. NAD.

## 2014-04-04 NOTE — [facility] Provider Note (Signed)
Attestation of Attending Supervision of Advanced Practitioner (PA/CNM/NP): Evaluation and management procedures were performed by the Advanced Practitioner under my supervision and collaboration.  I have reviewed the Advanced Practitioner's note and chart, and I agree with the management and plan.  Reva BoresPRATT,Pawel Soules S, MD [facility] Attending 04/04/2014 6:57 PM

## 2014-04-04 NOTE — Discharge Instructions (Signed)
Abdominal Pain, Women °Abdominal (stomach, pelvic, or belly) pain can be caused by many things. It is important to tell your doctor: °· The location of the pain. °· Does it come and go or is it present all the time? °· Are there things that start the pain (eating certain foods, exercise)? °· Are there other symptoms associated with the pain (fever, nausea, vomiting, diarrhea)? °All of this is helpful to know when trying to find the cause of the pain. °CAUSES  °· Stomach: virus or bacteria infection, or ulcer. °· Intestine: appendicitis (inflamed appendix), regional ileitis (Crohn's disease), ulcerative colitis (inflamed colon), irritable bowel syndrome, diverticulitis (inflamed diverticulum of the colon), or cancer of the stomach or intestine. °· Gallbladder disease or stones in the gallbladder. °· Kidney disease, kidney stones, or infection. °· Pancreas infection or cancer. °· Fibromyalgia (pain disorder). °· Diseases of the female organs: °¨ Uterus: fibroid (non-cancerous) tumors or infection. °¨ Fallopian tubes: infection or tubal pregnancy. °¨ Ovary: cysts or tumors. °¨ Pelvic adhesions (scar tissue). °¨ Endometriosis (uterus lining tissue growing in the pelvis and on the pelvic organs). °¨ Pelvic congestion syndrome (female organs filling up with blood just before the menstrual period). °¨ Pain with the menstrual period. °¨ Pain with ovulation (producing an egg). °¨ Pain with an IUD (intrauterine device, birth control) in the uterus. °¨ Cancer of the female organs. °· Functional pain (pain not caused by a disease, may improve without treatment). °· Psychological pain. °· Depression. °DIAGNOSIS  °Your doctor will decide the seriousness of your pain by doing an examination. °· Blood tests. °· X-rays. °· Ultrasound. °· CT scan (computed tomography, special type of X-ray). °· MRI (magnetic resonance imaging). °· Cultures, for infection. °· Barium enema (dye inserted in the large intestine, to better view it with  X-rays). °· Colonoscopy (looking in intestine with a lighted tube). °· Laparoscopy (minor surgery, looking in abdomen with a lighted tube). °· Major abdominal exploratory surgery (looking in abdomen with a large incision). °TREATMENT  °The treatment will depend on the cause of the pain.  °· Many cases can be observed and treated at home. °· Over-the-counter medicines recommended by your caregiver. °· Prescription medicine. °· Antibiotics, for infection. °· Birth control pills, for painful periods or for ovulation pain. °· Hormone treatment, for endometriosis. °· Nerve blocking injections. °· Physical therapy. °· Antidepressants. °· Counseling with a psychologist or psychiatrist. °· Minor or major surgery. °HOME CARE INSTRUCTIONS  °· Do not take laxatives, unless directed by your caregiver. °· Take over-the-counter pain medicine only if ordered by your caregiver. Do not take aspirin because it can cause an upset stomach or bleeding. °· Try a clear liquid diet (broth or water) as ordered by your caregiver. Slowly move to a bland diet, as tolerated, if the pain is related to the stomach or intestine. °· Have a thermometer and take your temperature several times a day, and record it. °· Bed rest and sleep, if it helps the pain. °· Avoid sexual intercourse, if it causes pain. °· Avoid stressful situations. °· Keep your follow-up appointments and tests, as your caregiver orders. °· If the pain does not go away with medicine or surgery, you may try: °¨ Acupuncture. °¨ Relaxation exercises (yoga, meditation). °¨ Group therapy. °¨ Counseling. °SEEK MEDICAL CARE IF:  °· You notice certain foods cause stomach pain. °· Your home care treatment is not helping your pain. °· You need stronger pain medicine. °· You want your IUD removed. °· You feel faint or   lightheaded. °· You develop nausea and vomiting. °· You develop a rash. °· You are having side effects or an allergy to your medicine. °SEEK IMMEDIATE MEDICAL CARE IF:  °· Your  pain does not go away or gets worse. °· You have a fever. °· Your pain is felt only in portions of the abdomen. The right side could possibly be appendicitis. The left lower portion of the abdomen could be colitis or diverticulitis. °· You are passing blood in your stools (bright red or black tarry stools, with or without vomiting). °· You have blood in your urine. °· You develop chills, with or without a fever. °· You pass out. °MAKE SURE YOU:  °· Understand these instructions. °· Will watch your condition. °· Will get help right away if you are not doing well or get worse. °Document Released: 03/20/2007 Document Revised: 10/07/2013 Document Reviewed: 04/09/2009 °[employer]® Patient Information ©2015 [employer]. This information is not intended to replace advice given to you by your health care provider. Make sure you discuss any questions you have with your health care provider. °Pelvic Pain °Female pelvic pain can be caused by many different things and start from a variety of places. Pelvic pain refers to pain that is located in the lower half of the abdomen and between your hips. The pain may occur over a short period of time (acute) or may be reoccurring (chronic). The cause of pelvic pain may be related to disorders affecting the female reproductive organs (gynecologic), but it may also be related to the bladder, kidney stones, an intestinal complication, or muscle or skeletal problems. Getting help right away for pelvic pain is important, especially if there has been severe, sharp, or a sudden onset of unusual pain. It is also important to get help right away because some types of pelvic pain can be life threatening.  °CAUSES  °Below are only some of the causes of pelvic pain. The causes of pelvic pain can be in one of several categories.  °· Gynecologic. °¨ Pelvic inflammatory disease. °¨ Sexually transmitted infection. °¨ Ovarian cyst or a twisted ovarian ligament (ovarian torsion). °¨ Uterine lining that  grows outside the uterus (endometriosis). °¨ Fibroids, cysts, or tumors. °¨ Ovulation. °· Pregnancy. °¨ Pregnancy that occurs outside the uterus (ectopic pregnancy). °¨ Miscarriage. °¨ Labor. °¨ Abruption of the placenta or ruptured uterus. °· Infection. °¨ Uterine infection (endometritis). °¨ Bladder infection. °¨ Diverticulitis. °¨ Miscarriage related to a uterine infection (septic abortion). °· Bladder. °¨ Inflammation of the bladder (cystitis). °¨ Kidney stone(s). °· Gastrointestinal. °¨ Constipation. °¨ Diverticulitis. °· Neurologic. °¨ Trauma. °¨ Feeling pelvic pain because of mental or emotional causes (psychosomatic). °· Cancers of the bowel or pelvis. °EVALUATION  °Your caregiver will want to take a careful history of your concerns. This includes recent changes in your health, a careful gynecologic history of your periods (menses), and a sexual history. Obtaining your family history and medical history is also important. Your caregiver may suggest a pelvic exam. A pelvic exam will help identify the location and severity of the pain. It also helps in the evaluation of which organ system may be involved. In order to identify the cause of the pelvic pain and be properly treated, your caregiver may order tests. These tests may include:  °· A pregnancy test. °· Pelvic ultrasonography. °· An X-ray exam of the abdomen. °· A urinalysis or evaluation of vaginal discharge. °· Blood tests. °HOME CARE INSTRUCTIONS  °· Only take over-the-counter or prescription medicines for pain,   discomfort, or fever as directed by your caregiver.   °· Rest as directed by your caregiver.   °· Eat a balanced diet.   °· Drink enough fluids to make your urine clear or pale yellow, or as directed.   °· Avoid sexual intercourse if it causes pain.   °· Apply warm or cold compresses to the lower abdomen depending on which one helps the pain.   °· Avoid stressful situations.   °· Keep a journal of your pelvic pain. Write down when it started,  where the pain is located, and if there are things that seem to be associated with the pain, such as food or your menstrual cycle. °· Follow up with your caregiver as directed.   °SEEK MEDICAL CARE IF: °· Your medicine does not help your pain. °· You have abnormal vaginal discharge. °SEEK IMMEDIATE MEDICAL CARE IF:  °· You have heavy bleeding from the vagina.   °· Your pelvic pain increases.   °· You feel light-headed or faint.   °· You have chills.   °· You have pain with urination or blood in your urine.   °· You have uncontrolled diarrhea or vomiting.   °· You have a fever or persistent symptoms for more than 3 days. °· You have a fever and your symptoms suddenly get worse.   °· You are being physically or sexually abused.   °MAKE SURE YOU: °· Understand these instructions. °· Will watch your condition. °· Will get help if you are not doing well or get worse. °Document Released: 04/19/2004 Document Revised: 10/07/2013 Document Reviewed: 09/12/2011 °[employer]® Patient Information ©2015 [employer]. This information is not intended to replace advice given to you by your health care provider. Make sure you discuss any questions you have with your health care provider. ° °
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
## 2014-04-04 NOTE — ED Provider Notes (Signed)
Medical screening examination/treatment/procedure(s) were performed by resident physician or non-physician practitioner and as supervising physician I was immediately available for consultation/collaboration.   Hussain Maimone DOUGLAS MD.   Keymarion Bearman D Alfie Alderfer, MD 04/04/14 1533 

## 2014-04-04 NOTE — MAU Note (Addendum)
Was at urgent care earlier today, went in for nausea. Found out preg and has trich.  Sent here for further eval.
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
## 2014-04-07 ENCOUNTER — Telehealth (HOSPITAL_COMMUNITY): Payer: Self-pay | Admitting: *Deleted

## 2014-04-07 ENCOUNTER — Encounter (HOSPITAL_COMMUNITY): Payer: Self-pay | Admitting: *Deleted

## 2014-04-07 LAB — CERVICOVAGINAL ANCILLARY ONLY
Chlamydia: NEGATIVE
NEISSERIA GONORRHEA: NEGATIVE
WET PREP (BD AFFIRM): NEGATIVE
Wet Prep (BD Affirm): NEGATIVE
Wet Prep (BD Affirm): POSITIVE — AB

## 2014-04-07 NOTE — ED Notes (Signed)
Pt.called for her lab results.  Pt. verified x 2 and given results. GC/Chlamydia neg., Affirm: Candida and Trich neg., Gardnerella pos.  Pt. told she was adequately treated with Flagyl for bacterial vaginosis.  She asked about the HIV test. I told her we did not do that test. If she wants it done [facility] by appointment. Vassie MoselleYork, Dillon Livermore M 04/07/2014

## 2014-05-28 LAB — OB RESULTS CONSOLE HIV ANTIBODY (ROUTINE TESTING): HIV: NONREACTIVE

## 2014-05-28 LAB — OB RESULTS CONSOLE ABO/RH: RH TYPE: POSITIVE

## 2014-05-28 LAB — OB RESULTS CONSOLE HEPATITIS B SURFACE ANTIGEN: Hepatitis B Surface Ag: NEGATIVE

## 2014-05-28 LAB — OB RESULTS CONSOLE ANTIBODY SCREEN: Antibody Screen: NEGATIVE

## 2014-05-28 LAB — OB RESULTS CONSOLE RUBELLA ANTIBODY, IGM: Rubella: IMMUNE

## 2014-10-08 ENCOUNTER — Other Ambulatory Visit: Payer: Self-pay | Admitting: Obstetrics & Gynecology

## 2014-10-13 NOTE — Addendum Note (Signed)
Addended by: Prescilla SoursKULWA, Domenick Quebedeaux W on: 10/13/2014 07:44 AM   Modules accepted: Orders

## 2014-10-30 ENCOUNTER — Encounter (HOSPITAL_COMMUNITY): Payer: Self-pay

## 2014-10-30 NOTE — Patient Instructions (Addendum)
   Your procedure is scheduled on: Tuesday, May 31  Enter through the Main Entrance of [facility] at: 1:30 PM Pick up the phone at the desk and dial [phone] and inform us of your arrival.  Please call this number if you have any problems the morning of surgery: [phone]  Remember: Do not eat food after midnight: Monday. However, patient may have a dry toast prior to 7 AM Tuesday, day of surgery. Do not drink clear liquids after: 11 AM Tuesday, day of surgery  Take these medicines the morning of surgery with a SIP OF WATER: NONE  Do not wear jewelry, make-up, or FINGER nail polish No metal in your hair or on your body. Do not wear lotions, powders, perfumes.  You may wear deodorant.  Do not bring valuables to the hospital.  Leave suitcase in the car. After Surgery it may be brought to your room. For patients being admitted to the hospital, checkout time is 11:00am the day of discharge.  Home with FOB [place] cell [phone]

## 2014-10-31 ENCOUNTER — Encounter (HOSPITAL_COMMUNITY): Payer: Self-pay

## 2014-10-31 ENCOUNTER — Encounter (HOSPITAL_COMMUNITY)
Admission: RE | Admit: 2014-10-31 | Discharge: 2014-10-31 | Disposition: A | Discharge status: Home / Self Care | Payer: Medicaid Other | Source: Ambulatory Visit | Attending: Obstetrics & Gynecology | Admitting: Obstetrics & Gynecology

## 2014-10-31 DIAGNOSIS — Z01818 Encounter for other preprocedural examination: Secondary | ICD-10-CM | POA: Diagnosis present

## 2014-10-31 LAB — CBC
HCT: 28.2 % — ABNORMAL LOW (ref 36.0–46.0)
HEMOGLOBIN: 9 g/dL — AB (ref 12.0–15.0)
MCH: 21.7 pg — ABNORMAL LOW (ref 26.0–34.0)
MCHC: 31.9 g/dL (ref 30.0–36.0)
MCV: 68 fL — AB (ref 78.0–100.0)
PLATELETS: 182 10*3/uL (ref 150–400)
RBC: 4.15 MIL/uL (ref 3.87–5.11)
RDW: 15.6 % — ABNORMAL HIGH (ref 11.5–15.5)
WBC: 8.7 10*3/uL (ref 4.0–10.5)

## 2014-10-31 LAB — TYPE AND SCREEN
ABO/RH(D): A POS
Antibody Screen: NEGATIVE

## 2014-11-01 LAB — RPR: RPR: NONREACTIVE

## 2014-11-04 ENCOUNTER — Encounter (HOSPITAL_COMMUNITY)
Admission: RE | Disposition: A | Discharge status: Home / Self Care | Payer: Self-pay | Source: Ambulatory Visit | Attending: Obstetrics & Gynecology

## 2014-11-04 ENCOUNTER — Inpatient Hospital Stay (HOSPITAL_COMMUNITY): Payer: Medicaid Other | Admitting: Anesthesiology

## 2014-11-04 ENCOUNTER — Inpatient Hospital Stay (HOSPITAL_COMMUNITY)
Admission: RE | Admit: 2014-11-04 | Discharge: 2014-11-07 | DRG: 766 | Disposition: A | Discharge status: Home / Self Care | Payer: Medicaid Other | Source: Ambulatory Visit | Attending: Obstetrics & Gynecology | Admitting: Obstetrics & Gynecology

## 2014-11-04 ENCOUNTER — Encounter (HOSPITAL_COMMUNITY): Payer: Self-pay | Admitting: Anesthesiology

## 2014-11-04 DIAGNOSIS — O9902 Anemia complicating childbirth: Secondary | ICD-10-CM | POA: Diagnosis present

## 2014-11-04 DIAGNOSIS — O99334 Smoking (tobacco) complicating childbirth: Secondary | ICD-10-CM | POA: Diagnosis present

## 2014-11-04 DIAGNOSIS — Z98891 History of uterine scar from previous surgery: Secondary | ICD-10-CM

## 2014-11-04 DIAGNOSIS — Z3A39 39 weeks gestation of pregnancy: Secondary | ICD-10-CM | POA: Diagnosis present

## 2014-11-04 DIAGNOSIS — D649 Anemia, unspecified: Secondary | ICD-10-CM | POA: Diagnosis present

## 2014-11-04 DIAGNOSIS — O99824 Streptococcus B carrier state complicating childbirth: Secondary | ICD-10-CM | POA: Diagnosis not present

## 2014-11-04 DIAGNOSIS — O3421 Maternal care for scar from previous cesarean delivery: Secondary | ICD-10-CM | POA: Diagnosis not present

## 2014-11-04 DIAGNOSIS — Z3483 Encounter for supervision of other normal pregnancy, third trimester: Secondary | ICD-10-CM | POA: Diagnosis present

## 2014-11-04 DIAGNOSIS — F17210 Nicotine dependence, cigarettes, uncomplicated: Secondary | ICD-10-CM | POA: Diagnosis present

## 2014-11-04 LAB — PREPARE RBC (CROSSMATCH)

## 2014-11-04 SURGERY — Surgical Case
Anesthesia: Spinal

## 2014-11-04 MED ORDER — LACTATED RINGERS IV SOLN
Freq: Once | INTRAVENOUS | Status: AC
  Administered 2014-11-04: 14:00:00 via INTRAVENOUS

## 2014-11-04 MED ORDER — MEPERIDINE HCL 25 MG/ML IJ SOLN: 6.2500 mg | INTRAMUSCULAR | Status: DC | PRN

## 2014-11-04 MED ORDER — PHENYLEPHRINE 8 MG IN D5W 100 ML (0.08MG/ML) PREMIX OPTIME
INJECTION | INTRAVENOUS | Status: AC
  Filled 2014-11-04: qty 100

## 2014-11-04 MED ORDER — LACTATED RINGERS IV SOLN
INTRAVENOUS | Status: DC | PRN
  Administered 2014-11-04: 17:00:00 via INTRAVENOUS

## 2014-11-04 MED ORDER — ZOLPIDEM TARTRATE 5 MG PO TABS: 5.0000 mg | ORAL_TABLET | Freq: Every evening | ORAL | Status: DC | PRN

## 2014-11-04 MED ORDER — FENTANYL CITRATE (PF) 100 MCG/2ML IJ SOLN
INTRAMUSCULAR | Status: AC
  Filled 2014-11-04: qty 2

## 2014-11-04 MED ORDER — MORPHINE SULFATE 0.5 MG/ML IJ SOLN
INTRAMUSCULAR | Status: AC
  Filled 2014-11-04: qty 10

## 2014-11-04 MED ORDER — MENTHOL 3 MG MT LOZG
1.0000 | LOZENGE | OROMUCOSAL | Status: DC | PRN
Start: 2014-11-04 — End: 2014-11-07

## 2014-11-04 MED ORDER — FERROUS SULFATE 325 (65 FE) MG PO TABS
325.0000 mg | ORAL_TABLET | Freq: Two times a day (BID) | ORAL | Status: DC
  Administered 2014-11-05 – 2014-11-07 (×5): 325 mg via ORAL
  Filled 2014-11-04 (×5): qty 1

## 2014-11-04 MED ORDER — OXYTOCIN 40 UNITS IN LACTATED RINGERS INFUSION - SIMPLE MED
INTRAVENOUS | Status: DC | PRN
  Administered 2014-11-04: 40 [IU] via INTRAVENOUS

## 2014-11-04 MED ORDER — TETANUS-DIPHTH-ACELL PERTUSSIS 5-2.5-18.5 LF-MCG/0.5 IM SUSP
0.5000 mL | Freq: Once | INTRAMUSCULAR | Status: AC
  Administered 2014-11-05: 0.5 mL via INTRAMUSCULAR

## 2014-11-04 MED ORDER — LACTATED RINGERS IV SOLN
INTRAVENOUS | Status: DC
  Administered 2014-11-05: 08:00:00 via INTRAVENOUS

## 2014-11-04 MED ORDER — SIMETHICONE 80 MG PO CHEW: 80.0000 mg | CHEWABLE_TABLET | ORAL | Status: DC | PRN

## 2014-11-04 MED ORDER — SCOPOLAMINE 1 MG/3DAYS TD PT72
1.0000 | MEDICATED_PATCH | Freq: Once | TRANSDERMAL | Status: AC
  Administered 2014-11-04: 1.5 mg via TRANSDERMAL

## 2014-11-04 MED ORDER — OXYCODONE-ACETAMINOPHEN 5-325 MG PO TABS
2.0000 | ORAL_TABLET | ORAL | Status: DC | PRN
  Administered 2014-11-05 – 2014-11-07 (×4): 2 via ORAL
  Filled 2014-11-04 (×4): qty 2

## 2014-11-04 MED ORDER — NALBUPHINE HCL 10 MG/ML IJ SOLN: 5.0000 mg | Freq: Once | INTRAMUSCULAR | Status: AC | PRN

## 2014-11-04 MED ORDER — CEFAZOLIN SODIUM-DEXTROSE 2-3 GM-% IV SOLR: 2.0000 g | INTRAVENOUS | Status: DC

## 2014-11-04 MED ORDER — OXYTOCIN 10 UNIT/ML IJ SOLN
INTRAMUSCULAR | Status: AC
  Filled 2014-11-04: qty 4

## 2014-11-04 MED ORDER — SIMETHICONE 80 MG PO CHEW
80.0000 mg | CHEWABLE_TABLET | Freq: Three times a day (TID) | ORAL | Status: DC
  Administered 2014-11-05 – 2014-11-07 (×8): 80 mg via ORAL
  Filled 2014-11-04 (×8): qty 1

## 2014-11-04 MED ORDER — NALBUPHINE HCL 10 MG/ML IJ SOLN
5.0000 mg | Freq: Once | INTRAMUSCULAR | Status: AC | PRN
Start: 2014-11-04 — End: 2014-11-04

## 2014-11-04 MED ORDER — ONDANSETRON HCL 4 MG/2ML IJ SOLN
INTRAMUSCULAR | Status: DC | PRN
  Administered 2014-11-04: 4 mg via INTRAVENOUS

## 2014-11-04 MED ORDER — IBUPROFEN 600 MG PO TABS: 600.0000 mg | ORAL_TABLET | Freq: Four times a day (QID) | ORAL | Status: DC | PRN

## 2014-11-04 MED ORDER — ONDANSETRON HCL 4 MG/2ML IJ SOLN
INTRAMUSCULAR | Status: AC
  Filled 2014-11-04: qty 2

## 2014-11-04 MED ORDER — NALBUPHINE HCL 10 MG/ML IJ SOLN
5.0000 mg | INTRAMUSCULAR | Status: DC | PRN
  Administered 2014-11-05: 5 mg via INTRAVENOUS
  Filled 2014-11-04: qty 1

## 2014-11-04 MED ORDER — SCOPOLAMINE 1 MG/3DAYS TD PT72
MEDICATED_PATCH | TRANSDERMAL | Status: AC
  Administered 2014-11-04: 1.5 mg via TRANSDERMAL
  Filled 2014-11-04: qty 1

## 2014-11-04 MED ORDER — KETOROLAC TROMETHAMINE 30 MG/ML IJ SOLN
30.0000 mg | Freq: Four times a day (QID) | INTRAMUSCULAR | Status: AC | PRN
  Administered 2014-11-04: 30 mg via INTRAMUSCULAR

## 2014-11-04 MED ORDER — SIMETHICONE 80 MG PO CHEW
80.0000 mg | CHEWABLE_TABLET | ORAL | Status: DC
  Administered 2014-11-04 – 2014-11-07 (×3): 80 mg via ORAL
  Filled 2014-11-04 (×3): qty 1

## 2014-11-04 MED ORDER — KETOROLAC TROMETHAMINE 30 MG/ML IJ SOLN: 30.0000 mg | Freq: Four times a day (QID) | INTRAMUSCULAR | Status: AC | PRN

## 2014-11-04 MED ORDER — CEFAZOLIN SODIUM-DEXTROSE 2-3 GM-% IV SOLR
INTRAVENOUS | Status: AC
  Administered 2014-11-04: 2 g via INTRAVENOUS
  Filled 2014-11-04: qty 50

## 2014-11-04 MED ORDER — FENTANYL CITRATE (PF) 100 MCG/2ML IJ SOLN
INTRAMUSCULAR | Status: DC | PRN
  Administered 2014-11-04: 25 ug via INTRATHECAL

## 2014-11-04 MED ORDER — IBUPROFEN 600 MG PO TABS
600.0000 mg | ORAL_TABLET | Freq: Four times a day (QID) | ORAL | Status: DC
  Administered 2014-11-04 – 2014-11-07 (×9): 600 mg via ORAL
  Filled 2014-11-04 (×10): qty 1

## 2014-11-04 MED ORDER — BUPIVACAINE IN DEXTROSE 0.75-8.25 % IT SOLN
INTRATHECAL | Status: DC | PRN
  Administered 2014-11-04: 1.4 mL via INTRATHECAL

## 2014-11-04 MED ORDER — NALOXONE HCL 0.4 MG/ML IJ SOLN: 0.4000 mg | INTRAMUSCULAR | Status: DC | PRN

## 2014-11-04 MED ORDER — DIPHENHYDRAMINE HCL 25 MG PO CAPS
25.0000 mg | ORAL_CAPSULE | ORAL | Status: DC | PRN
Start: 2014-11-04 — End: 2014-11-07
  Administered 2014-11-05: 25 mg via ORAL
  Filled 2014-11-04: qty 1

## 2014-11-04 MED ORDER — ACETAMINOPHEN 325 MG PO TABS: 650.0000 mg | ORAL_TABLET | ORAL | Status: DC | PRN

## 2014-11-04 MED ORDER — OXYTOCIN 40 UNITS IN LACTATED RINGERS INFUSION - SIMPLE MED: 62.5000 mL/h | INTRAVENOUS | Status: AC

## 2014-11-04 MED ORDER — NALBUPHINE HCL 10 MG/ML IJ SOLN: 5.0000 mg | INTRAMUSCULAR | Status: DC | PRN

## 2014-11-04 MED ORDER — SODIUM CHLORIDE 0.9 % IJ SOLN: 3.0000 mL | INTRAMUSCULAR | Status: DC | PRN

## 2014-11-04 MED ORDER — ONDANSETRON HCL 4 MG/2ML IJ SOLN
INTRAMUSCULAR | Status: AC
Start: 2014-11-04 — End: 2014-11-04
  Filled 2014-11-04: qty 2

## 2014-11-04 MED ORDER — LACTATED RINGERS IV SOLN
INTRAVENOUS | Status: DC
  Administered 2014-11-04 (×3): via INTRAVENOUS

## 2014-11-04 MED ORDER — NALOXONE HCL 1 MG/ML IJ SOLN: 1.0000 ug/kg/h | INTRAVENOUS | Status: DC | PRN

## 2014-11-04 MED ORDER — SENNOSIDES-DOCUSATE SODIUM 8.6-50 MG PO TABS
2.0000 | ORAL_TABLET | ORAL | Status: DC
  Administered 2014-11-04 – 2014-11-07 (×3): 2 via ORAL
  Filled 2014-11-04 (×3): qty 2

## 2014-11-04 MED ORDER — PHENYLEPHRINE 40 MCG/ML (10ML) SYRINGE FOR IV PUSH (FOR BLOOD PRESSURE SUPPORT)
PREFILLED_SYRINGE | INTRAVENOUS | Status: AC
  Filled 2014-11-04: qty 10

## 2014-11-04 MED ORDER — FENTANYL CITRATE (PF) 100 MCG/2ML IJ SOLN: 25.0000 ug | INTRAMUSCULAR | Status: DC | PRN

## 2014-11-04 MED ORDER — OXYCODONE-ACETAMINOPHEN 5-325 MG PO TABS
1.0000 | ORAL_TABLET | ORAL | Status: DC | PRN
  Administered 2014-11-05 – 2014-11-06 (×4): 1 via ORAL
  Filled 2014-11-04 (×6): qty 1

## 2014-11-04 MED ORDER — DIBUCAINE 1 % RE OINT: 1.0000 "application " | TOPICAL_OINTMENT | RECTAL | Status: DC | PRN

## 2014-11-04 MED ORDER — PHENYLEPHRINE 8 MG IN D5W 100 ML (0.08MG/ML) PREMIX OPTIME
INJECTION | INTRAVENOUS | Status: DC | PRN
  Administered 2014-11-04: 60 ug/min via INTRAVENOUS

## 2014-11-04 MED ORDER — MORPHINE SULFATE (PF) 0.5 MG/ML IJ SOLN
INTRAMUSCULAR | Status: DC | PRN
  Administered 2014-11-04: .15 mg via INTRATHECAL

## 2014-11-04 MED ORDER — ONDANSETRON HCL 4 MG/2ML IJ SOLN: 4.0000 mg | Freq: Three times a day (TID) | INTRAMUSCULAR | Status: DC | PRN

## 2014-11-04 MED ORDER — DIPHENHYDRAMINE HCL 50 MG/ML IJ SOLN: 12.5000 mg | INTRAMUSCULAR | Status: DC | PRN

## 2014-11-04 MED ORDER — PRENATAL MULTIVITAMIN CH
1.0000 | ORAL_TABLET | Freq: Every day | ORAL | Status: DC
  Administered 2014-11-05 – 2014-11-07 (×3): 1 via ORAL
  Filled 2014-11-04 (×3): qty 1

## 2014-11-04 MED ORDER — LANOLIN HYDROUS EX OINT: 1.0000 "application " | TOPICAL_OINTMENT | CUTANEOUS | Status: DC | PRN

## 2014-11-04 MED ORDER — WITCH HAZEL-GLYCERIN EX PADS: 1.0000 "application " | MEDICATED_PAD | CUTANEOUS | Status: DC | PRN

## 2014-11-04 MED ORDER — DIPHENHYDRAMINE HCL 25 MG PO CAPS: 25.0000 mg | ORAL_CAPSULE | Freq: Four times a day (QID) | ORAL | Status: DC | PRN

## 2014-11-04 MED ORDER — KETOROLAC TROMETHAMINE 30 MG/ML IJ SOLN
INTRAMUSCULAR | Status: AC
  Filled 2014-11-04: qty 1

## 2014-11-04 SURGICAL SUPPLY — 33 items
CLAMP CORD UMBIL (MISCELLANEOUS) IMPLANT
CLOTH BEACON ORANGE TIMEOUT ST (SAFETY) ×3 IMPLANT
DRAPE SHEET LG 3/4 BI-LAMINATE (DRAPES) IMPLANT
DRSG OPSITE POSTOP 4X10 (GAUZE/BANDAGES/DRESSINGS) ×3 IMPLANT
DURAPREP 26ML APPLICATOR (WOUND CARE) ×3 IMPLANT
ELECT REM PT RETURN 9FT ADLT (ELECTROSURGICAL) ×3
ELECTRODE REM PT RTRN 9FT ADLT (ELECTROSURGICAL) ×1 IMPLANT
EXTRACTOR VACUUM M CUP 4 TUBE (SUCTIONS) IMPLANT
EXTRACTOR VACUUM M CUP 4' TUBE (SUCTIONS)
GLOVE BIOGEL PI IND STRL 7.0 (GLOVE) ×1 IMPLANT
GLOVE BIOGEL PI INDICATOR 7.0 (GLOVE) ×2
GLOVE SURG SS PI 6.5 STRL IVOR (GLOVE) ×3 IMPLANT
GOWN STRL REUS W/TWL LRG LVL3 (GOWN DISPOSABLE) ×6 IMPLANT
KIT ABG SYR 3ML LUER SLIP (SYRINGE) IMPLANT
LIQUID BAND (GAUZE/BANDAGES/DRESSINGS) ×3 IMPLANT
NEEDLE HYPO 25X5/8 SAFETYGLIDE (NEEDLE) IMPLANT
NS IRRIG 1000ML POUR BTL (IV SOLUTION) ×3 IMPLANT
PACK C SECTION WH (CUSTOM PROCEDURE TRAY) ×3 IMPLANT
PAD OB MATERNITY 4.3X12.25 (PERSONAL CARE ITEMS) ×3 IMPLANT
RTRCTR C-SECT PINK 25CM LRG (MISCELLANEOUS) ×3 IMPLANT
SUT CHROMIC 1 CTX 36 (SUTURE) IMPLANT
SUT CHROMIC 2 0 CT 1 (SUTURE) ×6 IMPLANT
SUT MON AB 4-0 PS1 27 (SUTURE) ×3 IMPLANT
SUT PLAIN 1 NONE 54 (SUTURE) IMPLANT
SUT PLAIN 2 0 (SUTURE)
SUT PLAIN 2 0 XLH (SUTURE) IMPLANT
SUT PLAIN ABS 2-0 CT1 27XMFL (SUTURE) IMPLANT
SUT VIC AB 0 CTX 36 (SUTURE) ×2
SUT VIC AB 0 CTX36XBRD ANBCTRL (SUTURE) ×1 IMPLANT
SUT VIC AB 1 CTX 36 (SUTURE) ×4
SUT VIC AB 1 CTX36XBRD ANBCTRL (SUTURE) ×2 IMPLANT
TOWEL OR 17X24 6PK STRL BLUE (TOWEL DISPOSABLE) ×3 IMPLANT
TRAY FOLEY CATH SILVER 14FR (SET/KITS/TRAYS/PACK) ×3 IMPLANT

## 2014-11-04 NOTE — Anesthesia Preprocedure Evaluation (Signed)
Anesthesia Evaluation  Patient identified by MRN, date of birth, ID band Patient awake    Reviewed: Allergy & Precautions, NPO status , Patient's Chart, lab work & pertinent test results  Airway Mallampati: II  TM Distance: >3 FB Neck ROM: Full    Dental no notable dental hx. (+) Teeth Intact   Pulmonary Current Smoker,  breath sounds clear to auscultation  Pulmonary exam normal       Cardiovascular negative cardio ROS Normal cardiovascular examRhythm:Regular Rate:Normal     Neuro/Psych  Headaches, negative psych ROS   GI/Hepatic Neg liver ROS, GERD-  Medicated and Controlled,  Endo/Other  negative endocrine ROS  Renal/GU negative Renal ROS  negative genitourinary   Musculoskeletal negative musculoskeletal ROS (+)   Abdominal   Peds  Hematology  (+) anemia ,   Anesthesia Other Findings   Reproductive/Obstetrics (+) Pregnancy Previous C/Section                             Anesthesia Physical Anesthesia Plan  ASA: II  Anesthesia Plan: Spinal   Post-op Pain Management:    Induction:   Airway Management Planned: Natural Airway  Additional Equipment:   Intra-op Plan:   Post-operative Plan:   Informed Consent: I have reviewed the patients History and Physical, chart, labs and discussed the procedure including the risks, benefits and alternatives for the proposed anesthesia with the patient or authorized representative who has indicated his/her understanding and acceptance.     Plan Discussed with: CRNA, Anesthesiologist and Surgeon  Anesthesia Plan Comments:         Anesthesia Quick Evaluation

## 2014-11-04 NOTE — Consult Note (Signed)
Attended the repeat C-section delivery of this 3439 3/[redacted] week gestation female infant born to a 24 yo G3P1011, A+ mom.  GBS positive. Smoker. ROM at delivery.  Infant presented with spontaneous cry. Dried and stimulated.  Copious secretions and bulb suctioned.  Apgars 8 at 1 min 2 off for color) and 9 at 5 mins (1 off for color). Stable in room air, no gross abnormalities noted.  Infant left in care of L&D nurse to do skin to skin with mom.   HOLT, HARRIETT T, RN, NNP-BC

## 2014-11-04 NOTE — Anesthesia Postprocedure Evaluation (Signed)
  Anesthesia Post-op Note  Patient: Cathy Elliott  Procedure(s) Performed: Procedure(s): CESAREAN SECTION (N/A)  Patient Location: PACU  Anesthesia Type:Spinal  Level of Consciousness: awake, alert  and oriented  Airway and Oxygen Therapy: Patient Spontanous Breathing  Post-op Pain: none  Post-op Assessment: Post-op Vital signs reviewed, Patient's Cardiovascular Status Stable, Respiratory Function Stable, Patent Airway, No signs of Nausea or vomiting, Pain level controlled, No headache, No backache and No residual numbness  Post-op Vital Signs: Reviewed and stable  Last Vitals:  Filed Vitals:   11/04/14 1830  BP: 104/52  Pulse: 72  Temp:   Resp: 18    Complications: No apparent anesthesia complications

## 2014-11-04 NOTE — Brief Op Note (Signed)
11/04/2014  5:52 PM  PATIENT:  Gardiner RhymeKeydra M Cardwell  24 y.o. female  PRE-OPERATIVE DIAGNOSIS:  Prior Cesarean Section  POST-OPERATIVE DIAGNOSIS:  Prior Cesarean Section  PROCEDURE:  Procedure(s): CESAREAN SECTION (N/A)  SURGEON:  Surgeon(s) and Role:    * Hoover BrownsEma Dandra Velardi, MD - Primary  PHYSICIAN ASSISTANT:   ASSISTANTS: Venus Standard, CNM.    ANESTHESIA:   spinal  EBL:  Total I/O In: 2700 [I.V.:2700] Out: 800 [Urine:200; Blood:600]  BLOOD ADMINISTERED:none  DRAINS: none   LOCAL MEDICATIONS USED:  NONE  SPECIMEN:  Source of Specimen:  Cord blood  DISPOSITION OF SPECIMEN:  PATHOLOGY  COUNTS:  YES  TOURNIQUET:  * No tourniquets in log *  DICTATION: .Dragon Dictation  PLAN OF CARE: Admit to inpatient   PATIENT DISPOSITION:  PACU - hemodynamically stable.   Delay start of Pharmacological VTE agent (>24hrs) due to surgical blood loss or risk of bleeding: not applicable

## 2014-11-04 NOTE — Transfer of Care (Signed)
Immediate Anesthesia Transfer of Care Note  Patient: Cathy LacrosseKeydra M Elliott  Procedure(s) Performed: Procedure(s): CESAREAN SECTION (N/A)  Patient Location: PACU  Anesthesia Type:Spinal  Level of Consciousness: awake, alert , oriented and patient cooperative  Airway & Oxygen Therapy: Patient Spontanous Breathing  Post-op Assessment: Report given to RN and Post -op Vital signs reviewed and stable  Post vital signs: Reviewed and stable  Last Vitals:  Filed Vitals:   11/04/14 1414  BP: 116/73  Pulse: 76  Temp: 36.8 C  Resp: 16    Complications: No apparent anesthesia complications

## 2014-11-04 NOTE — Anesthesia Procedure Notes (Signed)
Spinal Patient location during procedure: OR Start time: 11/04/2014 4:16 PM Staffing Anesthesiologist: Mal AmabileFOSTER, Tavish Gettis Performed by: anesthesiologist  Preanesthetic Checklist Completed: patient identified, site marked, surgical consent, pre-op evaluation, timeout performed, IV checked, risks and benefits discussed and monitors and equipment checked Spinal Block Patient position: sitting Prep: site prepped and draped and DuraPrep Patient monitoring: heart rate, cardiac monitor, continuous pulse ox and blood pressure Approach: midline Location: L3-4 Injection technique: single-shot Needle Needle type: Sprotte  Needle gauge: 24 G Needle length: 9 cm Needle insertion depth: 5 cm Assessment Sensory level: T4 Additional Notes Patient tolerated procedure well. Adequate sensory level.
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
## 2014-11-04 NOTE — Op Note (Signed)
DATE OF SURGERY: 05/14/2014   PREOP DIAGNOSIS:  1. 39 week 3 day EGA intrauterine pregnancy 2. 1 prior cesarean section desiring a repeat cesarean section.   POSTOP DIAGNOSIS: Same as above.  PROCEDURE: Secondary low uterine segment transverse cesarean section via Pfannenstiel incision.     SURGEON: Dr.  Hoover BrownsEMA Naylin Burkle  ASSISTANT: Venus Standard, CNM  ANESTHESIA: Spinal  COMPLICATIONS: None  FINDINGS: Viable female infant in cephalic presentation, DOP, weight 7lbs 13.9 ounces , Apgar scores of 8 and 9. Normal uterus and fallopian tubes and ovaries bilaterally.    EBL: 600 cc  IV FLUID: 2700 cc LR   URINE OUTPUT: 200 cc clear urine  INDICATIONS: 24 y/o P1 with 1 prior cesarean who presented for a repeat cesarean section.  She was consented for the procedure after explaining the risks, benefits and alternatives of the procedure.     PROCEDURE:   Informed consent was obtained from the patient to undergo the procedure. She was taken to the operating room where her spinal anesthesia was found to be adequate. She was prepped and draped in the usual sterile fashion and a Foley catheter was placed. She received 2 g of IV Ancef preoperatively. A Pfannenstiel incision was made with the scalpel and the incision extended through the subcutaneous layer and also the fascia with the bovie. Small perforators in the subcutaneous layer were contained with the Bovie. The fascia was nicked in the midline and then was further separated from the rectus muscles bilaterally using Mayo scissors. Kochers were placed inferiorly and then superiorly to allow further separation of fascia from the rectus muscles.  The peritoneal cavity was entered bluntly with the fingers. The Alexis retractor was placed in. The bladder flap was created using Metzenbaum scissors.   The uterus was incised with a scalpel and the incision extended bluntly bilaterally with fingers. Copious clear amniotic fluid was noted.  The head then the  rest of the body was then delivered with abdominal pressure.  She delivered a viable female infant, apgar scores 8.9.  The cord was clamped and cut. Cord blood was collected.    The placenta was delivered with gentle traction on the umbilical cord. The uterus was exteriorized.  The edges of the uterus was grasped with Allis clamps and also T. Clamps. The uterus was cleared of clots and debris with a lap.  The incision uterine incision was closed with #1 Vicryl in a running locked stitch. An imbricating layer of came stitch was placed over the initial closure.  The uterus was returned into the abdomen.  Irrigation was applied and suctioned out. Excellent hemostasis was noted over the incision.  The muscles were then reapproximated using chromic suture.  Fascia was closed using 0 Vicryl in a running stitch. The subcutaneous layer was irrigated and suctioned out. Small perforators were contained with the bovie.  The skin was closed using 4-0 Monocryl. Dermabond was applied. Honeycomb was then applied. The patient was then cleaned and she was taken to the recovery room in stable condition. The neonate was also taken to the nursery in stable condition.   SPECIMEN: Placenta, umbilical cord blood  DISPOSITION: TO PACU, STABLE.

## 2014-11-05 ENCOUNTER — Encounter (HOSPITAL_COMMUNITY): Payer: Self-pay | Admitting: Obstetrics & Gynecology

## 2014-11-05 LAB — CBC
HEMATOCRIT: 24.3 % — AB (ref 36.0–46.0)
Hemoglobin: 7.8 g/dL — ABNORMAL LOW (ref 12.0–15.0)
MCH: 21.7 pg — AB (ref 26.0–34.0)
MCHC: 32.1 g/dL (ref 30.0–36.0)
MCV: 67.5 fL — ABNORMAL LOW (ref 78.0–100.0)
Platelets: 146 10*3/uL — ABNORMAL LOW (ref 150–400)
RBC: 3.6 MIL/uL — ABNORMAL LOW (ref 3.87–5.11)
RDW: 15.4 % (ref 11.5–15.5)
WBC: 8.9 10*3/uL (ref 4.0–10.5)

## 2014-11-05 MED ORDER — PNEUMOCOCCAL VAC POLYVALENT 25 MCG/0.5ML IJ INJ
0.5000 mL | INJECTION | INTRAMUSCULAR | Status: AC
  Administered 2014-11-05: 0.5 mL via INTRAMUSCULAR
  Filled 2014-11-05: qty 0.5

## 2014-11-05 NOTE — Progress Notes (Addendum)
Debbora LacrosseKeydra M Cardwell [id number]  Subjective: Postpartum Day 1: Repeat C/S due to scheduled repeat Patient sitting up in bed, has not ambulated yet.  Foley just d/c'd. Patient reports having "heavy bleeding" this am.  Denies cramping. Feeding:  Breast Contraceptive plan:  Undecided  Family plans circum[place] at outside [place].  Objective: Temp:  [97.4 F (36.3 C)-98.2 F (36.8 C)] 97.8 F (36.6 C) (06/01 0651) Pulse Rate:  [52-79] 79 (06/01 0651) Resp:  [16-26] 18 (06/01 0651) BP: (99-133)/(52-84) 103/62 mmHg (06/01 0651) SpO2:  [97 %-100 %] 100 % (06/01 0200)  CBC Latest Ref Rng 11/05/2014 10/31/2014 04/04/2014  WBC 4.0 - 10.5 K/uL 8.9 8.7 6.5  Hemoglobin 12.0 - 15.0 g/dL 7.8(L) 9.0(L) 10.1(L)  Hematocrit 36.0 - 46.0 % 24.3(L) 28.2(L) 31.3(L)  Platelets 150 - 400 K/uL 146(L) 182 216     Physical Exam:  General: alert Lochia: appropriate--no clots passed. Small amount lochia flow with fundal pressure. Uterine Fundus: firm Abdomen:  + bowel sounds, mild gaseous distension Incision: Honeycomb dressing CDI DVT Evaluation: No evidence of DVT seen on physical exam. Negative Homan's sign.   Assessment/Plan: Status post cesarean delivery, day 1. Stable Anemia without hemodynamic instability--chronic anemia Declines transfusion Continue current care. Fe BID Check orthostatics Repeat CBC tomorrow Will continue to observe lochia this am--RN to report if heavy.    Nigel BridgemanLATHAM, Jakarri Lesko MSN, CNM 11/05/2014, 8:25 AM

## 2014-11-05 NOTE — Anesthesia Postprocedure Evaluation (Signed)
  Anesthesia Post-op Note  Patient: Cathy Elliott  Procedure(s) Performed: Procedure(s): CESAREAN SECTION (N/A)  Patient Location: Mother/Baby  Anesthesia Type:Spinal  Level of Consciousness: awake, alert , oriented and patient cooperative  Airway and Oxygen Therapy: Patient Spontanous Breathing  Post-op Pain: none  Post-op Assessment: Post-op Vital signs reviewed, Patient's Cardiovascular Status Stable, Respiratory Function Stable, Patent Airway, No headache, No backache, No residual numbness and No residual motor weakness  Post-op Vital Signs: Reviewed and stable  Last Vitals:  Filed Vitals:   11/05/14 0651  BP: 103/62  Pulse: 79  Temp: 36.6 C  Resp: 18    Complications: No apparent anesthesia complications

## 2014-11-05 NOTE — Progress Notes (Signed)
Ambulates without dizziness

## 2014-11-05 NOTE — Progress Notes (Signed)
UR chart review completed.  

## 2014-11-05 NOTE — Addendum Note (Signed)
Addendum  created 11/05/14 0751 by Yolonda KidaAlison L Nanea Jared, CRNA   Modules edited: Notes Section   Notes Section:  File: [id number]

## 2014-11-06 LAB — CBC
HCT: 23.9 % — ABNORMAL LOW (ref 36.0–46.0)
HEMOGLOBIN: 7.6 g/dL — AB (ref 12.0–15.0)
MCH: 21.6 pg — ABNORMAL LOW (ref 26.0–34.0)
MCHC: 31.8 g/dL (ref 30.0–36.0)
MCV: 67.9 fL — ABNORMAL LOW (ref 78.0–100.0)
Platelets: 146 10*3/uL — ABNORMAL LOW (ref 150–400)
RBC: 3.52 MIL/uL — ABNORMAL LOW (ref 3.87–5.11)
RDW: 15.4 % (ref 11.5–15.5)
WBC: 8.8 10*3/uL (ref 4.0–10.5)

## 2014-11-06 LAB — BIRTH TISSUE RECOVERY COLLECTION (PLACENTA DONATION)

## 2014-11-06 NOTE — Progress Notes (Signed)
Subjective: Postpartum Day 2: elective repeat Cesarean Delivery  Patient up ad lib, reports no syncope or dizziness. Feeding:  bottle Contraceptive plan:  Nexplanon  Objective: Vital signs in last 24 hours: Temp:  [98 F (36.7 C)-98.4 F (36.9 C)] 98.3 F (36.8 C) (06/02 0500) Pulse Rate:  [67-68] 67 (06/02 0500) Resp:  [16-18] 18 (06/02 0500) BP: (104-118)/(50-71) 118/71 mmHg (06/02 0500) SpO2:  [99 %-100 %] 99 % (06/02 0500)  Physical Exam:  General: alert and cooperative Lochia: appropriate Uterine Fundus: firm Abdomen:  + bowel sounds, non distended Incision: healing well  Honeycomb dressing CDI DVT Evaluation: No evidence of DVT seen on physical exam. Homan's sign: Negative   Recent Labs  11/05/14 0525 11/06/14 0600  HGB 7.8* 7.6*  HCT 24.3* 23.9*  WBC 8.9 8.8    Assessment: Status post Cesarean section day 2. Doing well postoperatively.  Honeycomb dressing in place, no significant drainage Anemia - hemodynamicly stable.   Plan: Continue current care. Plan for discharge tomorrow    Kellianne Ek, CNM, MSN 11/06/2014. 10:28 AM

## 2014-11-06 NOTE — Discharge Summary (Signed)
Cesarean Section Delivery Discharge Summary  Cathy Elliott  DOB:    1991-01-08 MRN:    [id number] CSN:    [id number]  Date of admission:                  11/04/14  Date of discharge:                   11/07/14  Procedures this admission:  Repeat CS  Date of Delivery: 11/04/14  Newborn Data:  Live born  Information for the patient's newborn:  Aleigh, Grunden [[id number]]  female   Live born female  Birth Weight: 7 lb 13.9 oz (3569 g) APGAR: ,   Home with mother. Name: Jamir Circumcision Plan: out patient  History of Present Illness:  Cathy Elliott is a 24 y.o. female, [id number], who presents at [redacted]w[redacted]d weeks gestation. The patient has been followed at the [facility] and Gynecology division of [facility].    Her pregnancy has been complicated by:  Patient Active Problem List   Diagnosis Date Noted  . H/O cesarean section 11/04/2014  . Status post primary low transverse cesarean section 03/28/2013  . Mild or unspecified pre-eclampsia, with delivery 03/28/2013  . Smoker 03/26/2013  . Group B Streptococcus carrier, antepartum 03/26/2013  . Post-dates pregnancy 03/26/2013    Hospital course: The patient was admitted for schedule CS.   Her postpartum course was not complicated.  She was discharged to home on postpartum day 3 doing well.  Feeding: bottle  Contraception: Nexplanon Pt understands the risks are but not limited to irregular bleeding, formation of DVT, fluid fluctuations, elevation in blood pressure, stroke, breast tenderness and liver damage.  She states she will report any serious side effects.  She has been given verbal and written instructions and voiced a clear understanding.    Discharge hemoglobin: HEMOGLOBIN  Date Value Ref Range Status  11/06/2014 7.6* 12.0 - 15.0 g/dL Final   HCT  Date Value Ref Range Status  11/06/2014 23.9* 36.0 - 46.0 % Final   Anemia - hemodynamicly stable.    PreNatal  Labs ABO, Rh: --/--/A POS (05/31 1340)   Antibody: NEG (05/31 1340) Rubella:    immune RPR: Non Reactive (05/27 1403)  HBsAg: Negative (12/23 0000)  HIV: Non-reactive (12/23 0000)  GBS:  positive  Discharge Physical Exam:  General: alert and cooperative Lochia: appropriate Uterine Fundus: firm Incision: healing well DVT Evaluation: No evidence of DVT seen on physical exam.  Intrapartum Procedures: cesarean: low cervical, transverse Postpartum Procedures: none Complications-Operative and Postpartum: none  Discharge Diagnoses: Term Pregnancy-delivered,  asymptomatic anemia  Discharge Information:  Activity:           pelvic rest Diet:                routine Medications: PNV, Ibuprofen, Iron and Percocet Condition:      stable   Discharge to: home     Tesla Keeler, CNM, MSN  11/06/2014. 10:31 AM  Care After Cesarean Delivery  Refer to this sheet in the next few weeks. These instructions provide you with information on caring for yourself after your procedure. Your caregiver may also give you specific instructions. Your treatment has been planned according to current medical practices, but problems sometimes occur. Call your caregiver if you have any problems or questions after you go home. HOME CARE INSTRUCTIONS  Only take over-the-counter or prescription medicines as directed by your caregiver.  Do not drink alcohol, especially  if you are breastfeeding or taking medicine to relieve pain.  Do not chew or smoke tobacco.  Continue to use good perineal care. Good perineal care includes:  Wiping your perineum from front to back.  Keeping your perineum clean.  Check your cut (incision) daily for increased redness, drainage, swelling, or separation of skin.  Clean your incision gently with soap and water every day, and then pat it dry. If your caregiver says it is okay, leave the incision uncovered. Use a bandage (dressing) if the incision is draining fluid or appears  irritated. If the adhesive strips across the incision do not fall off within 7 days, carefully peel them off.  Hug a pillow when coughing or sneezing until your incision is healed. This helps to relieve pain.  Do not use tampons or douche until your caregiver says it is okay.  Shower, wash your hair, and take tub baths as directed by your caregiver.  Wear a well-fitting bra that provides breast support.  Limit wearing support panties or control-top hose.  Drink enough fluids to keep your urine clear or pale yellow.  Eat high-fiber foods such as whole grain cereals and breads, brown rice, beans, and fresh fruits and vegetables every day. These foods may help prevent or relieve constipation.  Resume activities such as climbing stairs, driving, lifting, exercising, or traveling as directed by your caregiver.  Talk to your caregiver about resuming sexual activities. This is dependent upon your risk of infection, your rate of healing, and your comfort and desire to resume sexual activity.  Try to have someone help you with your household activities and your newborn for at least a few days after you leave the hospital.  Rest as much as possible. Try to rest or take a nap when your newborn is sleeping.  Increase your activities gradually.  Keep all of your scheduled postpartum appointments. It is very important to keep your scheduled follow-up appointments. At these appointments, your caregiver will be checking to make sure that you are healing physically and emotionally. SEEK MEDICAL CARE IF:   You are passing large clots from your vagina. Save any clots to show your caregiver.  You have a foul smelling discharge from your vagina.  You have trouble urinating.  You are urinating frequently.  You have pain when you urinate.  You have a change in your bowel movements.  You have increasing redness, pain, or swelling near your incision.  You have pus draining from your  incision.  Your incision is separating.  You have painful, hard, or reddened breasts.  You have a severe headache.  You have blurred vision or see spots.  You feel sad or depressed.  You have thoughts of hurting yourself or your newborn.  You have questions about your care, the care of your newborn, or medicines.  You are dizzy or lightheaded.  You have a rash.  You have pain, redness, or swelling at the site of the removed intravenous access (IV) tube.  You have nausea or vomiting.  You stopped breastfeeding and have not had a menstrual period within 12 weeks of stopping.  You are not breastfeeding and have not had a menstrual period within 12 weeks of delivery.  You have a fever. SEEK IMMEDIATE MEDICAL CARE IF:  You have persistent pain.  You have chest pain.  You have shortness of breath.  You faint.  You have leg pain.  You have stomach pain.  Your vaginal bleeding saturates 2 or more sanitary pads  in 1 hour. MAKE SURE YOU:   Understand these instructions.  Will watch your condition.  Will get help right away if you are not doing well or get worse. Document Released: 02/12/2002 Document Revised: 02/15/2012 Document Reviewed: 01/18/2012 [facility] Patient Information 2014 [place], [place].   Postpartum Depression and Baby Blues  The postpartum period begins right after the birth of a baby. During this time, there is often a great amount of joy and excitement. It is also a time of considerable changes in the life of the parent(s). Regardless of how many times a mother gives birth, each child brings new challenges and dynamics to the family. It is not unusual to have feelings of excitement accompanied by confusing shifts in moods, emotions, and thoughts. All mothers are at risk of developing postpartum depression or the "baby blues." These mood changes can occur right after giving birth, or they may occur many months after giving birth. The baby blues or  postpartum depression can be mild or severe. Additionally, postpartum depression can resolve rather quickly, or it can be a long-term condition. CAUSES Elevated hormones and their rapid decline are thought to be a main cause of postpartum depression and the baby blues. There are a number of hormones that radically change during and after pregnancy. Estrogen and progesterone usually decrease immediately after delivering your baby. The level of thyroid hormone and various cortisol steroids also rapidly drop. Other factors that play a major role in these changes include major life events and genetics.  RISK FACTORS If you have any of the following risks for the baby blues or postpartum depression, know what symptoms to watch out for during the postpartum period. Risk factors that may increase the likelihood of getting the baby blues or postpartum depression include: 1. Havinga personal or family history of depression. 2. Having depression while being pregnant. 3. Having premenstrual or oral contraceptive-associated mood issues. 4. Having exceptional life stress. 5. Having marital conflict. 6. Lacking a social support network. 7. Having a baby with special needs. 8. Having health problems such as diabetes. SYMPTOMS Baby blues symptoms include:  Brief fluctuations in mood, such as going from extreme happiness to sadness.  Decreased concentration.  Difficulty sleeping.  Crying spells, tearfulness.  Irritability.  Anxiety. Postpartum depression symptoms typically begin within the first month after giving birth. These symptoms include:  Difficulty sleeping or excessive sleepiness.  Marked weight loss.  Agitation.  Feelings of worthlessness.  Lack of interest in activity or food. Postpartum psychosis is a very concerning condition and can be dangerous. Fortunately, it is rare. Displaying any of the following symptoms is cause for immediate medical attention. Postpartum psychosis  symptoms include:  Hallucinations and delusions.  Bizarre or disorganized behavior.  Confusion or disorientation. DIAGNOSIS  A diagnosis is made by an evaluation of your symptoms. There are no medical or lab tests that lead to a diagnosis, but there are various questionnaires that a caregiver may use to identify those with the baby blues, postpartum depression, or psychosis. Often times, a screening tool called the [place] Postnatal Depression Scale is used to diagnose depression in the postpartum period.  TREATMENT The baby blues usually goes away on its own in 1 to 2 weeks. Social support is often all that is needed. You should be encouraged to get adequate sleep and rest. Occasionally, you may be given medicines to help you sleep.  Postpartum depression requires treatment as it can last several months or longer if it is not treated. Treatment may include  individual or group therapy, medicine, or both to address any social, physiological, and psychological factors that may play a role in the depression. Regular exercise, a healthy diet, rest, and social support may also be strongly recommended.  Postpartum psychosis is more serious and needs treatment right away. Hospitalization is often needed. HOME CARE INSTRUCTIONS  Get as much rest as you can. Nap when the baby sleeps.  Exercise regularly. Some women find yoga and walking to be beneficial.  Eat a balanced and nourishing diet.  Do little things that you enjoy. Have a cup of tea, take a bubble bath, read your favorite magazine, or listen to your favorite music.  Avoid alcohol.  Ask for help with household chores, cooking, grocery shopping, or running errands as needed. Do not try to do everything.  Talk to people close to you about how you are feeling. Get support from your partner, family members, friends, or other new moms.  Try to stay positive in how you think. Think about the things you are grateful for.  Do not spend a lot  of time alone.  Only take medicine as directed by your caregiver.  Keep all your postpartum appointments.  Let your caregiver know if you have any concerns. SEEK MEDICAL CARE IF: You are having a reaction or problems with your medicine. SEEK IMMEDIATE MEDICAL CARE IF:  You have suicidal feelings.  You feel you may harm the baby or someone else. Document Released: 02/25/2004 Document Revised: 08/15/2011 Document Reviewed: 03/29/2011 [facility] Patient Information 2014 [place].   Breastfeeding Deciding to breastfeed is one of the best choices you can make for you and your baby. A change in hormones during pregnancy causes your breast tissue to grow and increases the number and size of your milk ducts. These hormones also allow proteins, sugars, and fats from your blood supply to make breast milk in your milk-producing glands. Hormones prevent breast milk from being released before your baby is born as well as prompt milk flow after birth. Once breastfeeding has begun, thoughts of your baby, as well as his or her sucking or crying, can stimulate the release of milk from your milk-producing glands.  BENEFITS OF BREASTFEEDING For Your Baby  Your first milk (colostrum) helps your baby's digestive system function better.   There are antibodies in your milk that help your baby fight off infections.   Your baby has a lower incidence of asthma, allergies, and sudden infant death syndrome.   The nutrients in breast milk are better for your baby than infant formulas and are designed uniquely for your baby's needs.   Breast milk improves your baby's brain development.   Your baby is less likely to develop other conditions, such as childhood obesity, asthma, or type 2 diabetes mellitus.  For You   Breastfeeding helps to create a very special bond between you and your baby.   Breastfeeding is convenient. Breast milk is always available at the correct temperature and costs nothing.    Breastfeeding helps to burn calories and helps you lose the weight gained during pregnancy.   Breastfeeding makes your uterus contract to its prepregnancy size faster and slows bleeding (lochia) after you give birth.   Breastfeeding helps to lower your risk of developing type 2 diabetes mellitus, osteoporosis, and breast or ovarian cancer later in life. SIGNS THAT YOUR BABY IS HUNGRY Early Signs of Hunger  Increased alertness or activity.  Stretching.  Movement of the head from side to side.  Movement of the  head and opening of the mouth when the corner of the mouth or cheek is stroked (rooting).  Increased sucking sounds, smacking lips, cooing, sighing, or squeaking.  Hand-to-mouth movements.  Increased sucking of fingers or hands. Late Signs of Hunger  Fussing.  Intermittent crying. Extreme Signs of Hunger Signs of extreme hunger will require calming and consoling before your baby will be able to breastfeed successfully. Do not wait for the following signs of extreme hunger to occur before you initiate breastfeeding:   Restlessness.  A loud, strong cry.   Screaming.  BREASTFEEDING BASICS Breastfeeding Initiation  Find a comfortable place to sit or lie down, with your neck and back well supported.  Place a pillow or rolled up blanket under your baby to bring him or her to the level of your breast (if you are seated). Nursing pillows are specially designed to help support your arms and your baby while you breastfeed.  Make sure that your baby's abdomen is facing your abdomen.   Gently massage your breast. With your fingertips, massage from your chest wall toward your nipple in a circular motion. This encourages milk flow. You may need to continue this action during the feeding if your milk flows slowly.  Support your breast with 4 fingers underneath and your thumb above your nipple. Make sure your fingers are well away from your nipple and your baby's mouth.    Stroke your baby's lips gently with your finger or nipple.   When your baby's mouth is open wide enough, quickly bring your baby to your breast, placing your entire nipple and as much of the colored area around your nipple (areola) as possible into your baby's mouth.   More areola should be visible above your baby's upper lip than below the lower lip.   Your baby's tongue should be between his or her lower gum and your breast.   Ensure that your baby's mouth is correctly positioned around your nipple (latched). Your baby's lips should create a seal on your breast and be turned out (everted).  It is common for your baby to suck about 2-3 minutes in order to start the flow of breast milk. Latching Teaching your baby how to latch on to your breast properly is very important. An improper latch can cause nipple pain and decreased milk supply for you and poor weight gain in your baby. Also, if your baby is not latched onto your nipple properly, he or she may swallow some air during feeding. This can make your baby fussy. Burping your baby when you switch breasts during the feeding can help to get rid of the air. However, teaching your baby to latch on properly is still the best way to prevent fussiness from swallowing air while breastfeeding. Signs that your baby has successfully latched on to your nipple:    Silent tugging or silent sucking, without causing you pain.   Swallowing heard between every 3-4 sucks.    Muscle movement above and in front of his or her ears while sucking.  Signs that your baby has not successfully latched on to nipple:   Sucking sounds or smacking sounds from your baby while breastfeeding.  Nipple pain. If you think your baby has not latched on correctly, slip your finger into the corner of your baby's mouth to break the suction and place it between your baby's gums. Attempt breastfeeding initiation again. Signs of Successful Breastfeeding Signs from your  baby:   A gradual decrease in the number of sucks  or complete cessation of sucking.   Falling asleep.   Relaxation of his or her body.   Retention of a small amount of milk in his or her mouth.   Letting go of your breast by himself or herself. Signs from you:  Breasts that have increased in firmness, weight, and size 1-3 hours after feeding.   Breasts that are softer immediately after breastfeeding.  Increased milk volume, as well as a change in milk consistency and color by the fifth day of breastfeeding.   Nipples that are not sore, cracked, or bleeding. Signs That Your Pecola Leisure is Getting Enough Milk  Wetting at least 3 diapers in a 24-hour period. The urine should be clear and pale yellow by age 73 days.  At least 3 stools in a 24-hour period by age 73 days. The stool should be soft and yellow.  At least 3 stools in a 24-hour period by age 70 days. The stool should be seedy and yellow.  No loss of weight greater than 10% of birth weight during the first 56 days of age.  Average weight gain of 4-7 ounces (113-198 g) per week after age 7 days.  Consistent daily weight gain by age 73 days, without weight loss after the age of 2 weeks. After a feeding, your baby may spit up a small amount. This is common. BREASTFEEDING FREQUENCY AND DURATION Frequent feeding will help you make more milk and can prevent sore nipples and breast engorgement. Breastfeed when you feel the need to reduce the fullness of your breasts or when your baby shows signs of hunger. This is called "breastfeeding on demand." Avoid introducing a pacifier to your baby while you are working to establish breastfeeding (the first 4-6 weeks after your baby is born). After this time you may choose to use a pacifier. Research has shown that pacifier use during the first year of a baby's life decreases the risk of sudden infant death syndrome (SIDS). Allow your baby to feed on each breast as long as he or she wants.  Breastfeed until your baby is finished feeding. When your baby unlatches or falls asleep while feeding from the first breast, offer the second breast. Because newborns are often sleepy in the first few weeks of life, you may need to awaken your baby to get him or her to feed. Breastfeeding times will vary from baby to baby. However, the following rules can serve as a guide to help you ensure that your baby is properly fed:  Newborns (babies 51 weeks of age or younger) may breastfeed every 1-3 hours.  Newborns should not go longer than 3 hours during the day or 5 hours during the night without breastfeeding.  You should breastfeed your baby a minimum of 8 times in a 24-hour period until you begin to introduce solid foods to your baby at around 3 months of age. BREAST MILK PUMPING Pumping and storing breast milk allows you to ensure that your baby is exclusively fed your breast milk, even at times when you are unable to breastfeed. This is especially important if you are going back to work while you are still breastfeeding or when you are not able to be present during feedings. Your lactation consultant can give you guidelines on how long it is safe to store breast milk.  A breast pump is a machine that allows you to pump milk from your breast into a sterile bottle. The pumped breast milk can then be stored in a refrigerator or  freezer. Some breast pumps are operated by hand, while others use electricity. Ask your lactation consultant which type will work best for you. Breast pumps can be purchased, but some hospitals and breastfeeding support groups lease breast pumps on a monthly basis. A lactation consultant can teach you how to hand express breast milk, if you prefer not to use a pump.  CARING FOR YOUR BREASTS WHILE YOU BREASTFEED Nipples can become dry, cracked, and sore while breastfeeding. The following recommendations can help keep your breasts moisturized and healthy:  Avoid using soap on your  nipples.   Wear a supportive bra. Although not required, special nursing bras and tank tops are designed to allow access to your breasts for breastfeeding without taking off your entire bra or top. Avoid wearing underwire-style bras or extremely tight bras.  Air dry your nipples for 3-26minutes after each feeding.   Use only cotton bra pads to absorb leaked breast milk. Leaking of breast milk between feedings is normal.   Use lanolin on your nipples after breastfeeding. Lanolin helps to maintain your skin's normal moisture barrier. If you use pure lanolin, you do not need to wash it off before feeding your baby again. Pure lanolin is not toxic to your baby. You may also hand express a few drops of breast milk and gently massage that milk into your nipples and allow the milk to air dry. In the first few weeks after giving birth, some women experience extremely full breasts (engorgement). Engorgement can make your breasts feel heavy, warm, and tender to the touch. Engorgement peaks within 3-5 days after you give birth. The following recommendations can help ease engorgement:  Completely empty your breasts while breastfeeding or pumping. You may want to start by applying warm, moist heat (in the shower or with warm water-soaked hand towels) just before feeding or pumping. This increases circulation and helps the milk flow. If your baby does not completely empty your breasts while breastfeeding, pump any extra milk after he or she is finished.  Wear a snug bra (nursing or regular) or tank top for 1-2 days to signal your body to slightly decrease milk production.  Apply ice packs to your breasts, unless this is too uncomfortable for you.  Make sure that your baby is latched on and positioned properly while breastfeeding. If engorgement persists after 48 hours of following these recommendations, contact your health care provider or a Advertising copywriter. OVERALL HEALTH CARE RECOMMENDATIONS WHILE  BREASTFEEDING  Eat healthy foods. Alternate between meals and snacks, eating 3 of each per day. Because what you eat affects your breast milk, some of the foods may make your baby more irritable than usual. Avoid eating these foods if you are sure that they are negatively affecting your baby.  Drink milk, fruit juice, and water to satisfy your thirst (about 10 glasses a day).   Rest often, relax, and continue to take your prenatal vitamins to prevent fatigue, stress, and anemia.  Continue breast self-awareness checks.  Avoid chewing and smoking tobacco.  Avoid alcohol and drug use. Some medicines that may be harmful to your baby can pass through breast milk. It is important to ask your health care provider before taking any medicine, including all over-the-counter and prescription medicine as well as vitamin and herbal supplements. It is possible to become pregnant while breastfeeding. If birth control is desired, ask your health care provider about options that will be safe for your baby. SEEK MEDICAL CARE IF:   You feel like you  want to stop breastfeeding or have become frustrated with breastfeeding.  You have painful breasts or nipples.  Your nipples are cracked or bleeding.  Your breasts are red, tender, or warm.  You have a swollen area on either breast.  You have a fever or chills.  You have nausea or vomiting.  You have drainage other than breast milk from your nipples.  Your breasts do not become full before feedings by the fifth day after you give birth.  You feel sad and depressed.  Your baby is too sleepy to eat well.  Your baby is having trouble sleeping.   Your baby is wetting less than 3 diapers in a 24-hour period.  Your baby has less than 3 stools in a 24-hour period.  Your baby's skin or the white part of his or her eyes becomes yellow.   Your baby is not gaining weight by 10 days of age. SEEK IMMEDIATE MEDICAL CARE IF:   Your baby is overly tired  (lethargic) and does not want to wake up and feed.  Your baby develops an unexplained fever. Document Released: 05/23/2005 Document Revised: 05/28/2013 Document Reviewed: 11/14/2012 [facility] Patient Information 2015 [place], [place]. This information is not intended to replace advice given to you by your health care provider. Make sure you discuss any questions you have with your health care provider.

## 2014-11-07 DIAGNOSIS — Z98891 History of uterine scar from previous surgery: Secondary | ICD-10-CM

## 2014-11-07 MED ORDER — OXYCODONE-ACETAMINOPHEN 5-325 MG PO TABS: 1.0000 | ORAL_TABLET | ORAL | Status: DC | PRN

## 2014-11-07 MED ORDER — IBUPROFEN 600 MG PO TABS: 600.0000 mg | ORAL_TABLET | Freq: Four times a day (QID) | ORAL | Status: DC

## 2014-11-07 MED ORDER — FERROUS SULFATE 325 (65 FE) MG PO TABS: 325.0000 mg | ORAL_TABLET | Freq: Two times a day (BID) | ORAL | Status: DC

## 2014-11-07 NOTE — Discharge Instructions (Signed)
Iron-Rich Diet An iron-rich diet contains foods that are good sources of iron. Iron is an important mineral that helps your body produce hemoglobin. Hemoglobin is a protein in red blood cells that carries oxygen to the body's tissues. Sometimes, the iron level in your blood can be low. This may be caused by:  A lack of iron in your diet.  Blood loss.  Times of growth, such as during pregnancy or during a child's growth and development. Low levels of iron can cause a decrease in the number of red blood cells. This can result in iron deficiency anemia. Iron deficiency anemia symptoms include:  Tiredness.  Weakness.  Irritability.  Increased chance of infection. Here are some recommendations for daily iron intake:  Males older than 25 years of age need 8 mg of iron per day.  Women ages 19 to 50 need 18 mg of iron per day.  Pregnant women need 27 mg of iron per day, and women who are over 19 years of age and breastfeeding need 9 mg of iron per day.  Women over the age of 50 need 8 mg of iron per day. SOURCES OF IRON There are 2 types of iron that are found in food: heme iron and nonheme iron. Heme iron is absorbed by the body better than nonheme iron. Heme iron is found in meat, poultry, and fish. Nonheme iron is found in grains, beans, and vegetables. Heme Iron Sources Food / Iron (mg)  Chicken liver, 3 oz (85 g)/ 10 mg  Beef liver, 3 oz (85 g)/ 5.5 mg  Oysters, 3 oz (85 g)/ 8 mg  Beef, 3 oz (85 g)/ 2 to 3 mg  Shrimp, 3 oz (85 g)/ 2.8 mg  Turkey, 3 oz (85 g)/ 2 mg  Chicken, 3 oz (85 g) / 1 mg  Fish (tuna, halibut), 3 oz (85 g)/ 1 mg  Pork, 3 oz (85 g)/ 0.9 mg Nonheme Iron Sources Food / Iron (mg)  Ready-to-eat breakfast cereal, iron-fortified / 3.9 to 7 mg  Tofu,  cup / 3.4 mg  Kidney beans,  cup / 2.6 mg  Baked potato with skin / 2.7 mg  Asparagus,  cup / 2.2 mg  Avocado / 2 mg  Dried peaches,  cup / 1.6 mg  Raisins,  cup / 1.5 mg  Soy milk, 1 cup  / 1.5 mg  Whole-wheat bread, 1 slice / 1.2 mg  Spinach, 1 cup / 0.8 mg  Broccoli,  cup / 0.6 mg IRON ABSORPTION Certain foods can decrease the body's absorption of iron. Try to avoid these foods and beverages while eating meals with iron-containing foods:  Coffee.  Tea.  Fiber.  Soy. Foods containing vitamin C can help increase the amount of iron your body absorbs from iron sources, especially from nonheme sources. Eat foods with vitamin C along with iron-containing foods to increase your iron absorption. Foods that are high in vitamin C include many fruits and vegetables. Some good sources are:  Fresh orange juice.  Oranges.  Strawberries.  Mangoes.  Grapefruit.  Red bell peppers.  Green bell peppers.  Broccoli.  Potatoes with skin.  Tomato juice. Document Released: 01/04/2005 Document Revised: 08/15/2011 Document Reviewed: 11/11/2010 [employer] Patient Information 2015 [employer]. This information is not intended to replace advice given to you by your health care provider. Make sure you discuss any questions you have with your health care provider.  

## 2014-11-07 NOTE — Lactation Note (Signed)
This note was copied from the chart of Cathy Elliott. Lactation Consultation Note  Patient Name: Cathy Elliott XBJYN'WToday's Date: 11/07/2014 Reason for consult: Initial assessment Mom on admission reported she was going to bottle feed. Now day of d/c she has decided to try BF since her milk is coming in. Breasts are full but not engorged. Nipple/aerola swollen with fullness, not compressible. Initiated hand pump for Mom to pre-pump, demonstrated hand expression to relieve fullness to help baby latch. Baby is noted to have anterior, lingual frenulum with heart shape to tongue. On suck exam, some chewing noted with gum lines compression. Attempted to latch baby after softening nipple/aerola on left breast. Baby frantic and could not latch. Gave baby appetizer of formula to settle him down and using breast compression baby took few suckles but again popped off the breast frantic. Initiated #24 nipple shield and baby did latch without difficulty. Demonstrated a good suckling pattern off/on, small amount of breast milk in the nipple shield. Reviewed use/cleaning of nipple shield. Hand out given.  Advised Mom if she wants to BF that baby needs to be at the breast with each feeding, 8-12 times or more in 24 hours and with feeding ques. Advised to use the nipple shield to help with latch since baby is used to bottle nipples and also has short frenulum which may be affecting latch. Encouraged to hand express or pre-pump to soften nipple/aerola before applying nipple shield. This will get her milk flow going as well. Advised baby should be at the breast 15-20 minutes both breasts each feeding when possible. Be sure to see breast milk in the nipple shield when baby comes off the breast.  If Mom decides to pump/bottle feed, advised to pump every 3 hours for 15 minutes and give baby EBM. Storage guidelines reviewed, advised to refer to page 25 Baby N Me booklet.  If Mom decides to continue bottle/formula discussed  how to dry her milk. Mom reports she will work with BF and see how it goes. OP f/u scheduled with lactation for Friday, 11/14/14 at 4:00 if needed. Lactation brochure left for review, advised of support group. Engorgement care reviewed if needed.   Maternal Data Has patient been taught Hand Expression?: Yes Does the patient have breastfeeding experience prior to this delivery?: No  Feeding Feeding Type: Breast Fed Length of feed: 10 min  LATCH Score/Interventions Latch: Grasps breast easily, tongue down, lips flanged, rhythmical sucking. (using #24 nipple shield, could not latch w/o NS)  Audible Swallowing: A few with stimulation  Type of Nipple: Everted at rest and after stimulation  Comfort (Breast/Nipple): Filling, red/small blisters or bruises, mild/mod discomfort  Problem noted: Filling Interventions (Filling): Hand pump;Massage  Hold (Positioning): Assistance needed to correctly position infant at breast and maintain latch. Intervention(s): Breastfeeding basics reviewed;Support Pillows;Position options;Skin to skin  LATCH Score: 7  Lactation Tools Discussed/Used Tools: Pump;Nipple Shields Nipple shield size: 20;24 Breast pump type: Manual WIC Program: Yes   Consult Status Consult Status: Complete    Cathy Elliott, Cathy Elliott 11/07/2014, 11:03 AM

## 2014-11-08 LAB — TYPE AND SCREEN
ABO/RH(D): A POS
Antibody Screen: NEGATIVE
Unit division: 0
Unit division: 0

## 2014-11-10 ENCOUNTER — Encounter (HOSPITAL_COMMUNITY): Payer: Self-pay | Admitting: *Deleted

## 2014-11-14 ENCOUNTER — Ambulatory Visit (HOSPITAL_COMMUNITY): Payer: Medicaid Other

## 2015-10-04 ENCOUNTER — Emergency Department (HOSPITAL_COMMUNITY)
Admission: EM | Admit: 2015-10-04 | Discharge: 2015-10-05 | Disposition: A | Discharge status: Home / Self Care | Payer: Medicaid Other | Attending: Emergency Medicine | Admitting: Emergency Medicine

## 2015-10-04 ENCOUNTER — Encounter (HOSPITAL_COMMUNITY): Payer: Self-pay | Admitting: *Deleted

## 2015-10-04 ENCOUNTER — Emergency Department (HOSPITAL_COMMUNITY)
Admission: EM | Admit: 2015-10-04 | Discharge: 2015-10-04 | Disposition: A | Discharge status: Home / Self Care | Payer: Medicaid Other | Source: Home / Self Care

## 2015-10-04 ENCOUNTER — Emergency Department (HOSPITAL_COMMUNITY): Payer: Medicaid Other

## 2015-10-04 DIAGNOSIS — S30861A Insect bite (nonvenomous) of abdominal wall, initial encounter: Secondary | ICD-10-CM

## 2015-10-04 DIAGNOSIS — R0602 Shortness of breath: Secondary | ICD-10-CM | POA: Insufficient documentation

## 2015-10-04 DIAGNOSIS — F17210 Nicotine dependence, cigarettes, uncomplicated: Secondary | ICD-10-CM | POA: Insufficient documentation

## 2015-10-04 DIAGNOSIS — R079 Chest pain, unspecified: Secondary | ICD-10-CM

## 2015-10-04 DIAGNOSIS — R61 Generalized hyperhidrosis: Secondary | ICD-10-CM | POA: Insufficient documentation

## 2015-10-04 DIAGNOSIS — Z8619 Personal history of other infectious and parasitic diseases: Secondary | ICD-10-CM | POA: Insufficient documentation

## 2015-10-04 DIAGNOSIS — Y9289 Other specified places as the place of occurrence of the external cause: Secondary | ICD-10-CM

## 2015-10-04 DIAGNOSIS — M545 Low back pain, unspecified: Secondary | ICD-10-CM

## 2015-10-04 DIAGNOSIS — Z8742 Personal history of other diseases of the female genital tract: Secondary | ICD-10-CM | POA: Diagnosis not present

## 2015-10-04 DIAGNOSIS — R6883 Chills (without fever): Secondary | ICD-10-CM | POA: Insufficient documentation

## 2015-10-04 DIAGNOSIS — R42 Dizziness and giddiness: Secondary | ICD-10-CM | POA: Insufficient documentation

## 2015-10-04 DIAGNOSIS — R000 Tachycardia, unspecified: Secondary | ICD-10-CM | POA: Insufficient documentation

## 2015-10-04 DIAGNOSIS — M549 Dorsalgia, unspecified: Secondary | ICD-10-CM | POA: Insufficient documentation

## 2015-10-04 DIAGNOSIS — R110 Nausea: Secondary | ICD-10-CM | POA: Insufficient documentation

## 2015-10-04 DIAGNOSIS — Z9889 Other specified postprocedural states: Secondary | ICD-10-CM | POA: Diagnosis not present

## 2015-10-04 DIAGNOSIS — R1084 Generalized abdominal pain: Secondary | ICD-10-CM | POA: Diagnosis not present

## 2015-10-04 DIAGNOSIS — W57XXXA Bitten or stung by nonvenomous insect and other nonvenomous arthropods, initial encounter: Secondary | ICD-10-CM

## 2015-10-04 DIAGNOSIS — Y9389 Activity, other specified: Secondary | ICD-10-CM

## 2015-10-04 DIAGNOSIS — R451 Restlessness and agitation: Secondary | ICD-10-CM | POA: Diagnosis not present

## 2015-10-04 DIAGNOSIS — Z87440 Personal history of urinary (tract) infections: Secondary | ICD-10-CM | POA: Diagnosis not present

## 2015-10-04 DIAGNOSIS — Y998 Other external cause status: Secondary | ICD-10-CM

## 2015-10-04 DIAGNOSIS — R109 Unspecified abdominal pain: Secondary | ICD-10-CM | POA: Diagnosis present

## 2015-10-04 DIAGNOSIS — F419 Anxiety disorder, unspecified: Secondary | ICD-10-CM | POA: Diagnosis not present

## 2015-10-04 MED ORDER — SODIUM CHLORIDE 0.9 % IV BOLUS (SEPSIS)
1000.0000 mL | Freq: Once | INTRAVENOUS | Status: AC
  Administered 2015-10-04: 1000 mL via INTRAVENOUS

## 2015-10-04 MED ORDER — FAMOTIDINE IN NACL 20-0.9 MG/50ML-% IV SOLN
20.0000 mg | INTRAVENOUS | Status: AC
  Administered 2015-10-04: 20 mg via INTRAVENOUS
  Filled 2015-10-04: qty 50

## 2015-10-04 MED ORDER — IBUPROFEN 800 MG PO TABS: 800.0000 mg | ORAL_TABLET | Freq: Three times a day (TID) | ORAL | Status: DC

## 2015-10-04 MED ORDER — FENTANYL CITRATE (PF) 100 MCG/2ML IJ SOLN
50.0000 ug | Freq: Once | INTRAMUSCULAR | Status: AC
  Administered 2015-10-04: 50 ug via INTRAVENOUS
  Filled 2015-10-04: qty 2

## 2015-10-04 MED ORDER — HYDROXYZINE HCL 25 MG PO TABS: 25.0000 mg | ORAL_TABLET | Freq: Four times a day (QID) | ORAL | Status: DC

## 2015-10-04 MED ORDER — TRAMADOL HCL 50 MG PO TABS: 50.0000 mg | ORAL_TABLET | Freq: Two times a day (BID) | ORAL | Status: DC | PRN

## 2015-10-04 MED ORDER — DEXAMETHASONE SODIUM PHOSPHATE 10 MG/ML IJ SOLN
10.0000 mg | Freq: Once | INTRAMUSCULAR | Status: AC
  Administered 2015-10-04: 10 mg via INTRAVENOUS
  Filled 2015-10-04: qty 1

## 2015-10-04 MED ORDER — LORAZEPAM 2 MG/ML IJ SOLN
1.0000 mg | Freq: Once | INTRAMUSCULAR | Status: AC
  Administered 2015-10-04: 1 mg via INTRAVENOUS
  Filled 2015-10-04: qty 1

## 2015-10-04 MED ORDER — METHYLPREDNISOLONE SODIUM SUCC 125 MG IJ SOLR: 125.0000 mg | Freq: Once | INTRAMUSCULAR | Status: DC

## 2015-10-04 MED ORDER — DIAZEPAM 5 MG PO TABS
5.0000 mg | ORAL_TABLET | Freq: Once | ORAL | Status: AC
  Administered 2015-10-04: 5 mg via ORAL
  Filled 2015-10-04: qty 1

## 2015-10-04 MED ORDER — KETOROLAC TROMETHAMINE 30 MG/ML IJ SOLN
30.0000 mg | Freq: Once | INTRAMUSCULAR | Status: AC
  Administered 2015-10-04: 30 mg via INTRAVENOUS
  Filled 2015-10-04: qty 1

## 2015-10-04 MED ORDER — FAMOTIDINE 20 MG PO TABS: 20.0000 mg | ORAL_TABLET | Freq: Once | ORAL | Status: DC

## 2015-10-04 MED ORDER — DIPHENHYDRAMINE HCL 50 MG/ML IJ SOLN: 25.0000 mg | Freq: Once | INTRAMUSCULAR | Status: DC

## 2015-10-04 MED ORDER — TETANUS-DIPHTH-ACELL PERTUSSIS 5-2.5-18.5 LF-MCG/0.5 IM SUSP: 0.5000 mL | Freq: Once | INTRAMUSCULAR | Status: DC

## 2015-10-04 MED ORDER — CYCLOBENZAPRINE HCL 10 MG PO TABS: 10.0000 mg | ORAL_TABLET | Freq: Two times a day (BID) | ORAL | Status: DC | PRN

## 2015-10-04 MED ORDER — DIPHENHYDRAMINE HCL 50 MG/ML IJ SOLN
25.0000 mg | Freq: Once | INTRAMUSCULAR | Status: AC
  Administered 2015-10-04: 25 mg via INTRAVENOUS
  Filled 2015-10-04: qty 1

## 2015-10-04 MED ORDER — ACETAMINOPHEN 500 MG PO TABS
1000.0000 mg | ORAL_TABLET | Freq: Once | ORAL | Status: AC
  Administered 2015-10-04: 1000 mg via ORAL
  Filled 2015-10-04: qty 2

## 2015-10-04 NOTE — ED Notes (Signed)
Patient transported to X-ray 

## 2015-10-04 NOTE — Discharge Instructions (Signed)
Abdominal Pain, Adult °Many things can cause abdominal pain. Usually, abdominal pain is not caused by a disease and will improve without treatment. It can often be observed and treated at home. Your health care provider will do a physical exam and possibly order blood tests and X-rays to help determine the seriousness of your pain. However, in many cases, more time must pass before a clear cause of the pain can be found. Before that point, your health care provider may not know if you need more testing or further treatment. °HOME CARE INSTRUCTIONS °Monitor your abdominal pain for any changes. The following actions may help to alleviate any discomfort you are experiencing: °· Only take over-the-counter or prescription medicines as directed by your health care provider. °· Do not take laxatives unless directed to do so by your health care provider. °· Try a clear liquid diet (broth, tea, or water) as directed by your health care provider. Slowly move to a bland diet as tolerated. °SEEK MEDICAL CARE IF: °· You have unexplained abdominal pain. °· You have abdominal pain associated with nausea or diarrhea. °· You have pain when you urinate or have a bowel movement. °· You experience abdominal pain that wakes you in the night. °· You have abdominal pain that is worsened or improved by eating food. °· You have abdominal pain that is worsened with eating fatty foods. °· You have a fever. °SEEK IMMEDIATE MEDICAL CARE IF: °· Your pain does not go away within 2 hours. °· You keep throwing up (vomiting). °· Your pain is felt only in portions of the abdomen, such as the right side or the left lower portion of the abdomen. °· You pass bloody or black tarry stools. °MAKE SURE YOU: °· Understand these instructions. °· Will watch your condition. °· Will get help right away if you are not doing well or get worse. °  °This information is not intended to replace advice given to you by your health care provider. Make sure you discuss  any questions you have with your health care provider. °  °Document Released: 03/02/2005 Document Revised: 02/11/2015 Document Reviewed: 01/30/2013 °Elsevier Interactive Patient Education ©2016 [employer] Inc[employer] ° °Back Pain, Adult °Back pain is very common in adults. The cause of back pain is rarely dangerous and the pain often gets better over time. The cause of your back pain may not be known. Some common causes of back pain include: °· Strain of the muscles or ligaments supporting the spine. °· Wear and tear (degeneration) of the spinal disks. °· Arthritis. °· Direct injury to the back. °For many people, back pain may return. Since back pain is rarely dangerous, most people can learn to manage this condition on their own. °HOME CARE INSTRUCTIONS °Watch your back pain for any changes. The following actions may help to lessen any discomfort you are feeling: °· Remain active. It is stressful on your back to sit or stand in one place for long periods of time. Do not sit, drive, or stand in one place for more than 30 minutes at a time. Take short walks on even surfaces as soon as you are able. Try to increase the length of time you walk each day. °· Exercise regularly as directed by your health care provider. Exercise helps your back heal faster. It also helps avoid future injury by keeping your muscles strong and flexible. °· Do not stay in bed. Resting more than 1-2 days can delay your recovery. °· Pay attention to your body when you bend and lift.   The most comfortable positions are those that put less stress on your recovering back. Always use proper lifting techniques, including: °¨ Bending your knees. °¨ Keeping the load close to your body. °¨ Avoiding twisting. °· Find a comfortable position to sleep. Use a firm mattress and lie on your side with your knees slightly bent. If you lie on your back, put a pillow under your knees. °· Avoid feeling anxious or stressed. Stress increases muscle tension and can worsen back  pain. It is important to recognize when you are anxious or stressed and learn ways to manage it, such as with exercise. °· Take medicines only as directed by your health care provider. Over-the-counter medicines to reduce pain and inflammation are often the most helpful. Your health care provider may prescribe muscle relaxant drugs. These medicines help dull your pain so you can more quickly return to your normal activities and healthy exercise. °· Apply ice to the injured area: °¨ Put ice in a plastic bag. °¨ Place a towel between your skin and the bag. °¨ Leave the ice on for 20 minutes, 2-3 times a day for the first 2-3 days. After that, ice and heat may be alternated to reduce pain and spasms. °· Maintain a healthy weight. Excess weight puts extra stress on your back and makes it difficult to maintain good posture. °SEEK MEDICAL CARE IF: °· You have pain that is not relieved with rest or medicine. °· You have increasing pain going down into the legs or buttocks. °· You have pain that does not improve in one week. °· You have night pain. °· You lose weight. °· You have a fever or chills. °SEEK IMMEDIATE MEDICAL CARE IF:  °· You develop new bowel or bladder control problems. °· You have unusual weakness or numbness in your arms or legs. °· You develop nausea or vomiting. °· You develop abdominal pain. °· You feel faint. °  °This information is not intended to replace advice given to you by your health care provider. Make sure you discuss any questions you have with your health care provider. °  °Document Released: 05/23/2005 Document Revised: 06/13/2014 Document Reviewed: 09/24/2013 °Elsevier Interactive Patient Education ©2016 Elsevier Inc. ° °
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
## 2015-10-04 NOTE — ED Notes (Signed)
Pt states she was bitten by spider on her umbilicus at 4PM. Pt states she began feeling light headed, short of breath and chest tightness at 620PM. Pt hyperventilating in triage, states her back and abdomen are cramping. Pt states the spider was black with red stripes.

## 2015-10-04 NOTE — ED Notes (Signed)
Pt seen here earlier for the same.  Pt reports a spider bite to her navel.  Per EMS they showed pt a picture of a black widow and pt reports that the spider looked, "exactly like that."  Per EMS there is darkness around her navel.  Pt c/o pain 10/10 and states it wraps around to her back.

## 2015-10-05 NOTE — ED Provider Notes (Signed)
Medical screening examination/treatment/procedure(s) were conducted as a shared visit with non-physician practitioner(s) and myself.  I personally evaluated the patient during the encounter.   EKG Interpretation   Date/Time:  Sunday October 04 2015 21:23:03 EDT Ventricular Rate:  85 PR Interval:  143 QRS Duration: 82 QT Interval:  404 QTC Calculation: 480 R Axis:   72 Text Interpretation:  Sinus rhythm Normal ECG No significant change since  last tracing Confirmed by Shaliyah Taite MD, Naliah Eddington ([id number]) on 10/04/2015 9:50:31 PM     24  y.o. female presents with hysteria after seeing a spider in the car and concern that she may have been bitten. There is no bite mark or lesion on her abdomen. It is highly doubtful she was bitten by anything and she shows no acute findings c/w toxicologic complication. I explained that there are no lethal or dangerous spiders in the state of [place] and black widow spiders would typically not be found hiding in people's clothing or navels. I recommended she go home and treat herself with supportive care until symptoms resolve.  See related encounter note   Lyndal Pulleyaniel Roselani Grajeda, MD 10/05/15 1530

## 2016-02-15 ENCOUNTER — Telehealth (HOSPITAL_COMMUNITY): Payer: Self-pay
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
## 2016-03-04 ENCOUNTER — Encounter (HOSPITAL_COMMUNITY): Payer: Self-pay | Admitting: *Deleted

## 2016-03-04 ENCOUNTER — Inpatient Hospital Stay (HOSPITAL_COMMUNITY)
Admission: AD | Admit: 2016-03-04 | Discharge: 2016-03-04 | Disposition: A | Discharge status: Home / Self Care | Payer: Medicaid Other | Source: Ambulatory Visit | Attending: Obstetrics and Gynecology | Admitting: Obstetrics and Gynecology

## 2016-03-04 DIAGNOSIS — Z3A08 8 weeks gestation of pregnancy: Secondary | ICD-10-CM | POA: Insufficient documentation

## 2016-03-04 DIAGNOSIS — O210 Mild hyperemesis gravidarum: Secondary | ICD-10-CM | POA: Diagnosis present

## 2016-03-04 DIAGNOSIS — F17210 Nicotine dependence, cigarettes, uncomplicated: Secondary | ICD-10-CM | POA: Diagnosis not present

## 2016-03-04 DIAGNOSIS — O99331 Smoking (tobacco) complicating pregnancy, first trimester: Secondary | ICD-10-CM | POA: Diagnosis not present

## 2016-03-04 DIAGNOSIS — O219 Vomiting of pregnancy, unspecified: Secondary | ICD-10-CM | POA: Diagnosis not present

## 2016-03-04 LAB — URINALYSIS, ROUTINE W REFLEX MICROSCOPIC
Bilirubin Urine: NEGATIVE
Glucose, UA: NEGATIVE mg/dL
KETONES UR: NEGATIVE mg/dL
LEUKOCYTES UA: NEGATIVE
NITRITE: NEGATIVE
PH: 7 (ref 5.0–8.0)
PROTEIN: NEGATIVE mg/dL
Specific Gravity, Urine: 1.02 (ref 1.005–1.030)

## 2016-03-04 LAB — URINE MICROSCOPIC-ADD ON

## 2016-03-04 LAB — POCT PREGNANCY, URINE: PREG TEST UR: POSITIVE — AB

## 2016-03-04 MED ORDER — PROMETHAZINE HCL 25 MG RE SUPP: 25.0000 mg | Freq: Four times a day (QID) | RECTAL | 0 refills | Status: DC | PRN

## 2016-03-04 MED ORDER — PROMETHAZINE HCL 25 MG PO TABS: 25.0000 mg | ORAL_TABLET | Freq: Four times a day (QID) | ORAL | 0 refills | Status: DC | PRN

## 2016-03-04 NOTE — MAU Note (Signed)
Donette LarryMelanie Bhambri CNM in Triage discussing lab results and plan of care.

## 2016-03-04 NOTE — Progress Notes (Signed)
Written and verbal d/c instructions given and understanding voiced. Pt d/c home from Triage after being seen by Fabian NovemberM Bhambri CNM
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
## 2016-03-04 NOTE — Discharge Instructions (Signed)

## 2016-03-04 NOTE — MAU Note (Signed)
Pt presents to MAU with complaints of nausea and vomiting for approximately a week and a half. Pt states LMP was at the beginning of August. Denies any vaginal bleeding, reports clear vaginal discharge. 4 Positive home pregnancy test

## 2016-03-05 ENCOUNTER — Encounter (HOSPITAL_COMMUNITY): Payer: Self-pay | Admitting: *Deleted

## 2016-03-05 ENCOUNTER — Inpatient Hospital Stay (HOSPITAL_COMMUNITY): Payer: Medicaid Other

## 2016-03-05 ENCOUNTER — Inpatient Hospital Stay (HOSPITAL_COMMUNITY)
Admission: AD | Admit: 2016-03-05 | Discharge: 2016-03-05 | Disposition: A | Discharge status: Home / Self Care | Payer: Medicaid Other | Source: Ambulatory Visit | Attending: Obstetrics & Gynecology | Admitting: Obstetrics & Gynecology

## 2016-03-05 DIAGNOSIS — N760 Acute vaginitis: Secondary | ICD-10-CM

## 2016-03-05 DIAGNOSIS — O23591 Infection of other part of genital tract in pregnancy, first trimester: Secondary | ICD-10-CM | POA: Diagnosis not present

## 2016-03-05 DIAGNOSIS — O200 Threatened abortion: Secondary | ICD-10-CM

## 2016-03-05 DIAGNOSIS — O99331 Smoking (tobacco) complicating pregnancy, first trimester: Secondary | ICD-10-CM | POA: Diagnosis not present

## 2016-03-05 DIAGNOSIS — F17210 Nicotine dependence, cigarettes, uncomplicated: Secondary | ICD-10-CM | POA: Insufficient documentation

## 2016-03-05 DIAGNOSIS — O209 Hemorrhage in early pregnancy, unspecified: Secondary | ICD-10-CM | POA: Diagnosis present

## 2016-03-05 DIAGNOSIS — B9689 Other specified bacterial agents as the cause of diseases classified elsewhere: Secondary | ICD-10-CM

## 2016-03-05 LAB — CBC
HCT: 32.5 % — ABNORMAL LOW (ref 36.0–46.0)
Hemoglobin: 10.7 g/dL — ABNORMAL LOW (ref 12.0–15.0)
MCH: 22.4 pg — ABNORMAL LOW (ref 26.0–34.0)
MCHC: 32.9 g/dL (ref 30.0–36.0)
MCV: 68.1 fL — ABNORMAL LOW (ref 78.0–100.0)
PLATELETS: 278 10*3/uL (ref 150–400)
RBC: 4.77 MIL/uL (ref 3.87–5.11)
RDW: 15.1 % (ref 11.5–15.5)
WBC: 7.5 10*3/uL (ref 4.0–10.5)

## 2016-03-05 LAB — HCG, QUANTITATIVE, PREGNANCY: hCG, Beta Chain, Quant, S: 38029 m[IU]/mL — ABNORMAL HIGH (ref <5)

## 2016-03-05 LAB — WET PREP, GENITAL
Sperm: NONE SEEN
TRICH WET PREP: NONE SEEN
YEAST WET PREP: NONE SEEN

## 2016-03-05 LAB — ABO/RH: ABO/RH(D): A POS

## 2016-03-05 MED ORDER — OXYCODONE-ACETAMINOPHEN 5-325 MG PO TABS
1.0000 | ORAL_TABLET | Freq: Once | ORAL | Status: AC
Start: 2016-03-05 — End: 2016-03-05
  Administered 2016-03-05: 1 via ORAL
  Filled 2016-03-05: qty 1

## 2016-03-05 MED ORDER — METRONIDAZOLE 500 MG PO TABS: 500.0000 mg | ORAL_TABLET | Freq: Two times a day (BID) | ORAL | 0 refills | Status: DC

## 2016-03-05 NOTE — MAU Note (Signed)
Pt presents to MAU with complaints of lower abdominal cramping radiating into her lower back with vaginal bleeding. Pt was evaluated yesterday in MAU for nausea and did not have bleeding until today
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
## 2016-03-05 NOTE — Discharge Instructions (Signed)
Bacterial Vaginosis Bacterial vaginosis is a vaginal infection that occurs when the normal balance of bacteria in the vagina is disrupted. It results from an overgrowth of certain bacteria. This is the most common vaginal infection in women of childbearing age. Treatment is important to prevent complications, especially in pregnant women, as it can cause a premature delivery. CAUSES  Bacterial vaginosis is caused by an increase in harmful bacteria that are normally present in smaller amounts in the vagina. Several different kinds of bacteria can cause bacterial vaginosis. However, the reason that the condition develops is not fully understood. RISK FACTORS Certain activities or behaviors can put you at an increased risk of developing bacterial vaginosis, including:  Having a new sex partner or multiple sex partners.  Douching.  Using an intrauterine device (IUD) for contraception. Women do not get bacterial vaginosis from toilet seats, bedding, swimming pools, or contact with objects around them. SIGNS AND SYMPTOMS  Some women with bacterial vaginosis have no signs or symptoms. Common symptoms include:  Grey vaginal discharge.  A fishlike odor with discharge, especially after sexual intercourse.  Itching or burning of the vagina and vulva.  Burning or pain with urination. DIAGNOSIS  Your health care provider will take a medical history and examine the vagina for signs of bacterial vaginosis. A sample of vaginal fluid may be taken. Your health care provider will look at this sample under a microscope to check for bacteria and abnormal cells. A vaginal pH test may also be done.  TREATMENT  Bacterial vaginosis may be treated with antibiotic medicines. These may be given in the form of a pill or a vaginal cream. A second round of antibiotics may be prescribed if the condition comes back after treatment. Because bacterial vaginosis increases your risk for sexually transmitted diseases, getting  treated can help reduce your risk for chlamydia, gonorrhea, HIV, and herpes. HOME CARE INSTRUCTIONS   Only take over-the-counter or prescription medicines as directed by your health care provider.  If antibiotic medicine was prescribed, take it as directed. Make sure you finish it even if you start to feel better.  Tell all sexual partners that you have a vaginal infection. They should see their health care provider and be treated if they have problems, such as a mild rash or itching.  During treatment, it is important that you follow these instructions:  Avoid sexual activity or use condoms correctly.  Do not douche.  Avoid alcohol as directed by your health care provider.  Avoid breastfeeding as directed by your health care provider. SEEK MEDICAL CARE IF:   Your symptoms are not improving after 3 days of treatment.  You have increased discharge or pain.  You have a fever. MAKE SURE YOU:   Understand these instructions.  Will watch your condition.  Will get help right away if you are not doing well or get worse. FOR MORE INFORMATION  Centers for Disease Control and Prevention, Division of STD Prevention: SolutionApps.co.zawww.cdc.gov/std American Sexual Health Association (ASHA): www.ashastd.org    This information is not intended to replace advice given to you by your health care provider. Make sure you discuss any questions you have with your health care provider.   Document Released: 05/23/2005 Document Revised: 06/13/2014 Document Reviewed: 01/02/2013 Elsevier Interactive Patient Education 2016 [employer]  Threatened Miscarriage A threatened miscarriage is when you have vaginal bleeding during your first 20 weeks of pregnancy but the pregnancy has not ended. Your doctor will do tests to make sure you are still pregnant.  cause of the bleeding may not be known. This condition does not mean your pregnancy will end. It does increase the risk of it ending (complete miscarriage). °HOME  CARE  °· Make sure you keep all your doctor visits for prenatal care. °· Get plenty of rest. °· Do not have sex or use tampons if you have vaginal bleeding. °· Do not douche. °· Do not smoke or use drugs. °· Do not drink alcohol. °· Avoid caffeine. °GET HELP IF: °· You have light bleeding from your vagina. °· You have belly pain or cramping. °· You have a fever. °GET HELP RIGHT AWAY IF:  °· You have heavy bleeding from your vagina. °· You have clots of blood coming from your vagina. °· You have bad pain or cramps in your low back or belly. °· You have fever, chills, and bad belly pain. °MAKE SURE YOU:  °· Understand these instructions. °· Will watch your condition. °· Will get help right away if you are not doing well or get worse. °  °This information is not intended to replace advice given to you by your health care provider. Make sure you discuss any questions you have with your health care provider. °  °Document Released: 05/05/2008 Document Revised: 05/28/2013 Document Reviewed: 03/19/2013 °Elsevier Interactive Patient Education ©2016 [employer]vier Inc. ° °

## 2016-03-06 LAB — HIV ANTIBODY (ROUTINE TESTING W REFLEX): HIV Screen 4th Generation wRfx: NONREACTIVE

## 2016-03-07 LAB — GC/CHLAMYDIA PROBE AMP (~~LOC~~) NOT AT ARMC
Chlamydia: NEGATIVE
NEISSERIA GONORRHEA: NEGATIVE

## 2016-04-12 ENCOUNTER — Inpatient Hospital Stay (HOSPITAL_COMMUNITY)
Admission: AD | Admit: 2016-04-12 | Discharge: 2016-04-12 | Disposition: A | Discharge status: Home / Self Care | Payer: Medicaid Other | Source: Ambulatory Visit | Attending: Family Medicine | Admitting: Family Medicine

## 2016-04-12 ENCOUNTER — Encounter (HOSPITAL_COMMUNITY): Payer: Self-pay | Admitting: *Deleted

## 2016-04-12 DIAGNOSIS — R109 Unspecified abdominal pain: Secondary | ICD-10-CM

## 2016-04-12 DIAGNOSIS — O99332 Smoking (tobacco) complicating pregnancy, second trimester: Secondary | ICD-10-CM | POA: Insufficient documentation

## 2016-04-12 DIAGNOSIS — Z3A14 14 weeks gestation of pregnancy: Secondary | ICD-10-CM | POA: Insufficient documentation

## 2016-04-12 DIAGNOSIS — O212 Late vomiting of pregnancy: Secondary | ICD-10-CM | POA: Insufficient documentation

## 2016-04-12 DIAGNOSIS — O219 Vomiting of pregnancy, unspecified: Secondary | ICD-10-CM | POA: Diagnosis not present

## 2016-04-12 DIAGNOSIS — O3482 Maternal care for other abnormalities of pelvic organs, second trimester: Secondary | ICD-10-CM | POA: Insufficient documentation

## 2016-04-12 DIAGNOSIS — O98812 Other maternal infectious and parasitic diseases complicating pregnancy, second trimester: Secondary | ICD-10-CM | POA: Insufficient documentation

## 2016-04-12 DIAGNOSIS — F17210 Nicotine dependence, cigarettes, uncomplicated: Secondary | ICD-10-CM | POA: Insufficient documentation

## 2016-04-12 DIAGNOSIS — N83209 Unspecified ovarian cyst, unspecified side: Secondary | ICD-10-CM | POA: Insufficient documentation

## 2016-04-12 DIAGNOSIS — O26892 Other specified pregnancy related conditions, second trimester: Secondary | ICD-10-CM | POA: Diagnosis not present

## 2016-04-12 DIAGNOSIS — R12 Heartburn: Secondary | ICD-10-CM | POA: Insufficient documentation

## 2016-04-12 LAB — URINALYSIS, ROUTINE W REFLEX MICROSCOPIC
Bilirubin Urine: NEGATIVE
GLUCOSE, UA: NEGATIVE mg/dL
KETONES UR: NEGATIVE mg/dL
LEUKOCYTES UA: NEGATIVE
Nitrite: NEGATIVE
PROTEIN: NEGATIVE mg/dL
Specific Gravity, Urine: 1.015 (ref 1.005–1.030)
pH: 8 (ref 5.0–8.0)

## 2016-04-12 LAB — URINE MICROSCOPIC-ADD ON

## 2016-04-12 MED ORDER — ONDANSETRON 8 MG PO TBDP
8.0000 mg | ORAL_TABLET | Freq: Once | ORAL | Status: AC
  Administered 2016-04-12: 8 mg via ORAL
  Filled 2016-04-12: qty 1

## 2016-04-12 MED ORDER — COMFORT FIT MATERNITY SUPP SM MISC: 1.0000 [IU] | Freq: Every day | 0 refills | Status: DC | PRN

## 2016-04-12 MED ORDER — ONDANSETRON 4 MG PO TBDP: 4.0000 mg | ORAL_TABLET | Freq: Three times a day (TID) | ORAL | 0 refills | Status: DC | PRN

## 2016-04-12 MED ORDER — RANITIDINE HCL 150 MG PO TABS: 150.0000 mg | ORAL_TABLET | Freq: Two times a day (BID) | ORAL | 0 refills | Status: DC

## 2016-04-12 NOTE — MAU Note (Signed)
Pt states she is having abdominal pain that started two weeks ago.  The abdominal pain also goes to her left hip.  The abdominal pain feels like needles in her c-section scar.  Pt states she is having nausea and vomiting that only allows her to keep some stuff down.
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
## 2016-04-12 NOTE — Discharge Instructions (Signed)
Heartburn During Pregnancy  Heartburn happens when stomach acid goes up into the esophagus. The esophagus is the tube between the mouth and the stomach. This acid causes a burning pain in the chest or throat. This happens more often in the later part of pregnancy because the womb (uterus) gets larger. It may also happen because of hormone changes. Heartburn problems often go away after giving birth. HOME CARE  Take all medicine as told by your doctor.  Raise the head of your bed with blocks only as told by your doctor.  Do not exercise right after eating.  Avoid eating 2-3 hours before bed. Do not lie down right after eating.  Eat small meals throughout the day instead of 3 large meals.  Avoid foods that give you heartburn. Foods you may want to avoid include:  Peppers.  Chocolate.  High-fat foods, including fried foods.  Spicy foods.  Garlic and onions.  Citrus fruits, including oranges, grapefruit, lemons, and limes.  Food containing tomatoes or tomato products.  Mint.  Bubbly (carbonated) drinks and drinks with caffeine.  Vinegar. GET HELP IF:  You have any belly (abdominal) pain.  You feel burning in your upper belly or chest, especially after eating or lying down.  You feel sick to your stomach (nauseous) and throw up (vomit).  Your stomach feels upset after you eat. GET HELP RIGHT AWAY IF:  You have bad chest pain that goes down your arm or into your jaw or neck.  You feel sweaty, dizzy, or light-headed.  You have trouble breathing.  You throw up blood.  You have trouble or pain when swallowing.  You have bloody or black poop (stool).  You have heartburn more than 3 times a week, for more than 2 weeks. MAKE SURE YOU:  Understand these instructions.  Will watch your condition.  Will get help right away if you are not doing well or get worse.   This information is not intended to replace advice given to you by your health care provider. Make sure  you discuss any questions you have with your health care provider.   Document Released: 06/25/2010 Document Revised: 05/28/2013 Document Reviewed: 01/09/2013 Elsevier Interactive Patient Education 2016 Elsevier Inc. Morning Sickness Morning sickness is when you feel sick to your stomach (nauseous) during pregnancy. You may feel sick to your stomach and throw up (vomit). You may feel sick in the morning, but you can feel this way any time of day. Some women feel very sick to their stomach and cannot stop throwing up (hyperemesis gravidarum). HOME CARE  Only take medicines as told by your doctor.  Take multivitamins as told by your doctor. Taking multivitamins before getting pregnant can stop or lessen the harshness of morning sickness.  Eat dry toast or unsalted crackers before getting out of bed.  Eat 5 to 6 small meals a day.  Eat dry and bland foods like rice and baked potatoes.  Do not drink liquids with meals. Drink between meals.  Do not eat greasy, fatty, or spicy foods.  Have someone cook for you if the smell of food causes you to feel sick or throw up.  If you feel sick to your stomach after taking prenatal vitamins, take them at night or with a snack.  Eat protein when you need a snack (nuts, yogurt, cheese).  Eat unsweetened gelatins for dessert.  Wear a bracelet used for sea sickness (acupressure wristband).  Go to a doctor that puts thin needles into certain body points (  acupuncture) to improve how you feel.  Do not smoke.  Use a humidifier to keep the air in your house free of odors.  Get lots of fresh air. GET HELP IF:  You need medicine to feel better.  You feel dizzy or lightheaded.  You are losing weight. GET HELP RIGHT AWAY IF:   You feel very sick to your stomach and cannot stop throwing up.  You pass out (faint). MAKE SURE YOU:  Understand these instructions.  Will watch your condition.  Will get help right away if you are not doing well  or get worse.   This information is not intended to replace advice given to you by your health care provider. Make sure you discuss any questions you have with your health care provider.   Document Released: 06/30/2004 Document Revised: 06/13/2014 Document Reviewed: 11/07/2012 Elsevier Interactive Patient Education 2016 Elsevier Inc. Abdominal Pain During Pregnancy Belly (abdominal) pain is common during pregnancy. Most of the time, it is not a serious problem. Other times, it can be a sign that something is wrong with the pregnancy. Always tell your doctor if you have belly pain. HOME CARE Monitor your belly pain for any changes. The following actions may help you feel better:  Do not have sex (intercourse) or put anything in your vagina until you feel better.  Rest until your pain stops.  Drink clear fluids if you feel sick to your stomach (nauseous). Do not eat solid food until you feel better.  Only take medicine as told by your doctor.  Keep all doctor visits as told. GET HELP RIGHT AWAY IF:   You are bleeding, leaking fluid, or pieces of tissue come out of your vagina.  You have more pain or cramping.  You keep throwing up (vomiting).  You have pain when you pee (urinate) or have blood in your pee.  You have a fever.  You do not feel your baby moving as much.  You feel very weak or feel like passing out.  You have trouble breathing, with or without belly pain.  You have a very bad headache and belly pain.  You have fluid leaking from your vagina and belly pain.  You keep having watery poop (diarrhea).  Your belly pain does not go away after resting, or the pain gets worse. MAKE SURE YOU:   Understand these instructions.  Will watch your condition.  Will get help right away if you are not doing well or get worse.   This information is not intended to replace advice given to you by your health care provider. Make sure you discuss any questions you have with  your health care provider.   Document Released: 05/11/2009 Document Revised: 01/23/2013 Document Reviewed: 12/20/2012 Elsevier Interactive Patient Education [employer]2016 Elsevier Inc.

## 2016-05-03 ENCOUNTER — Encounter: Payer: Medicaid Other | Admitting: Obstetrics & Gynecology

## 2016-05-22 ENCOUNTER — Encounter (HOSPITAL_COMMUNITY): Payer: Self-pay | Admitting: *Deleted

## 2016-05-22 ENCOUNTER — Inpatient Hospital Stay (HOSPITAL_COMMUNITY)
Admission: AD | Admit: 2016-05-22 | Discharge: 2016-05-22 | Disposition: A | Discharge status: Home / Self Care | Payer: Medicaid Other | Source: Ambulatory Visit | Attending: Obstetrics and Gynecology | Admitting: Obstetrics and Gynecology

## 2016-05-22 ENCOUNTER — Inpatient Hospital Stay (HOSPITAL_COMMUNITY): Payer: Medicaid Other

## 2016-05-22 DIAGNOSIS — O2340 Unspecified infection of urinary tract in pregnancy, unspecified trimester: Secondary | ICD-10-CM | POA: Insufficient documentation

## 2016-05-22 DIAGNOSIS — O209 Hemorrhage in early pregnancy, unspecified: Secondary | ICD-10-CM

## 2016-05-22 DIAGNOSIS — Z833 Family history of diabetes mellitus: Secondary | ICD-10-CM | POA: Insufficient documentation

## 2016-05-22 DIAGNOSIS — Z87891 Personal history of nicotine dependence: Secondary | ICD-10-CM | POA: Diagnosis not present

## 2016-05-22 DIAGNOSIS — Z3A19 19 weeks gestation of pregnancy: Secondary | ICD-10-CM | POA: Insufficient documentation

## 2016-05-22 DIAGNOSIS — O2342 Unspecified infection of urinary tract in pregnancy, second trimester: Secondary | ICD-10-CM

## 2016-05-22 DIAGNOSIS — N83209 Unspecified ovarian cyst, unspecified side: Secondary | ICD-10-CM | POA: Insufficient documentation

## 2016-05-22 DIAGNOSIS — Z8489 Family history of other specified conditions: Secondary | ICD-10-CM | POA: Diagnosis not present

## 2016-05-22 DIAGNOSIS — O4692 Antepartum hemorrhage, unspecified, second trimester: Secondary | ICD-10-CM | POA: Diagnosis present

## 2016-05-22 DIAGNOSIS — O34219 Maternal care for unspecified type scar from previous cesarean delivery: Secondary | ICD-10-CM | POA: Diagnosis not present

## 2016-05-22 DIAGNOSIS — Z87440 Personal history of urinary (tract) infections: Secondary | ICD-10-CM | POA: Insufficient documentation

## 2016-05-22 DIAGNOSIS — Z809 Family history of malignant neoplasm, unspecified: Secondary | ICD-10-CM | POA: Diagnosis not present

## 2016-05-22 DIAGNOSIS — Z8619 Personal history of other infectious and parasitic diseases: Secondary | ICD-10-CM | POA: Diagnosis not present

## 2016-05-22 DIAGNOSIS — D649 Anemia, unspecified: Secondary | ICD-10-CM | POA: Insufficient documentation

## 2016-05-22 LAB — WET PREP, GENITAL
Clue Cells Wet Prep HPF POC: NONE SEEN
SPERM: NONE SEEN
TRICH WET PREP: NONE SEEN
WBC WET PREP: NONE SEEN
YEAST WET PREP: NONE SEEN

## 2016-05-22 LAB — URINALYSIS, ROUTINE W REFLEX MICROSCOPIC
BILIRUBIN URINE: NEGATIVE
Glucose, UA: NEGATIVE mg/dL
KETONES UR: NEGATIVE mg/dL
NITRITE: NEGATIVE
Protein, ur: 30 mg/dL — AB
SPECIFIC GRAVITY, URINE: 1.028 (ref 1.005–1.030)
pH: 5 (ref 5.0–8.0)

## 2016-05-22 LAB — CBC
HCT: 29.2 % — ABNORMAL LOW (ref 36.0–46.0)
Hemoglobin: 9.7 g/dL — ABNORMAL LOW (ref 12.0–15.0)
MCH: 22.5 pg — ABNORMAL LOW (ref 26.0–34.0)
MCHC: 33.2 g/dL (ref 30.0–36.0)
MCV: 67.7 fL — AB (ref 78.0–100.0)
Platelets: 211 10*3/uL (ref 150–400)
RBC: 4.31 MIL/uL (ref 3.87–5.11)
RDW: 15 % (ref 11.5–15.5)
WBC: 11.8 10*3/uL — ABNORMAL HIGH (ref 4.0–10.5)

## 2016-05-22 LAB — IRON AND TIBC
Iron: 18 ug/dL — ABNORMAL LOW (ref 28–170)
Saturation Ratios: 5 % — ABNORMAL LOW (ref 10.4–31.8)
TIBC: 393 ug/dL (ref 250–450)
UIBC: 375 ug/dL

## 2016-05-22 LAB — FERRITIN: Ferritin: 17 ng/mL (ref 11–307)

## 2016-05-22 MED ORDER — CEPHALEXIN 500 MG PO CAPS
500.0000 mg | ORAL_CAPSULE | Freq: Four times a day (QID) | ORAL | 0 refills | Status: DC
Start: 2016-05-22 — End: 2016-08-20

## 2016-05-22 NOTE — Discharge Instructions (Signed)
Pregnancy and Urinary Tract Infection °WHAT IS A URINARY TRACT INFECTION? °A urinary tract infection (UTI) is an infection of any part of the urinary tract. This includes the kidneys, the tubes that connect your kidneys to your bladder (ureters), the bladder, and the tube that carries urine out of your body (urethra). These organs make, store, and get rid of urine in the body. A UTI can be a bladder infection (cystitis) or a kidney infection (pyelonephritis). This infection may be caused by fungi, viruses, and bacteria. Bacteria are the most common cause of UTIs. °You are more likely to develop a UTI during pregnancy because: °· The physical and hormonal changes your body goes through can make it easier for bacteria to get into your urinary tract. °· Your growing baby puts pressure on your uterus and can affect urine flow. °DOES A UTI PLACE MY BABY AT RISK? °An untreated UTI during pregnancy could lead to a kidney infection, which can cause health problems that could affect your baby. Possible complications of an untreated UTI include: °· Having your baby before 37 weeks of pregnancy (premature). °· Having a baby with a low birth weight. °· Developing high blood pressure during pregnancy (preeclampsia). °WHAT ARE THE SYMPTOMS OF A UTI? °Symptoms of a UTI include: °· Fever. °· Frequent urination or passing small amounts of urine frequently. °· Needing to urinate urgently. °· Pain or a burning sensation with urination. °· Urine that smells bad or unusual. °· Cloudy urine. °· Pain in the lower abdomen or back. °· Trouble urinating. °· Blood in the urine. °· Vomiting or being less hungry than normal. °· Diarrhea or abdominal pain. °· Vaginal discharge. °WHAT ARE THE TREATMENT OPTIONS FOR A UTI DURING PREGNANCY? °Treatment for this condition may include: °· Antibiotic medicines that are safe to take during pregnancy. °· Other medicines to treat less common causes of UTI. °HOW CAN I PREVENT A UTI? °To prevent a UTI: °· Go  to the bathroom as soon as you feel the need. °· Always wipe from front to back. °· Wash your genital area with soap and warm water daily. °· Empty your bladder before and after sex. °· Wear cotton underwear. °· Limit your intake of high sugar foods or drinks, such as regular soda, juice, and sweets.. °· Drink 6-8 glasses of water daily. °· Do not wear tight-fitting pants. °· Do not douche or use deodorant sprays. °· Do not drink alcohol, caffeine, or carbonated drinks. These can irritate the bladder. °WHEN SHOULD I SEEK MEDICAL CARE? °Seek medical care if: °· Your symptoms do not improve or get worse. °· You have a fever after two days of treatment. °· You have a rash. °· You have abnormal vaginal discharge. °· You have back or side pain. °· You have chills. °· You have nausea and vomiting. °WHEN SHOULD I SEEK IMMEDIATE MEDICAL CARE? °Seek immediate medical care if you are pregnant and: °· You feel contractions in your uterus. °· You have lower belly pain. °· You have a gush of fluid from your vagina. °· You have blood in your urine. °· You are vomiting and cannot keep down any medicines or water. °This information is not intended to replace advice given to you by your health care provider. Make sure you discuss any questions you have with your health care provider. °Document Released: 09/17/2010 Document Revised: 10/26/2015 Document Reviewed: 04/13/2015 °Elsevier Interactive Patient Education © 2017 Elsevier Inc. ° °
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
## 2016-05-22 NOTE — MAU Note (Signed)
Feels like she is going to faint.  Gets over heated.  Yesterday, started bleeding- bled through 2 pad, filled the toilet. 2nd time she has had bleeding with preg.not bleeding this morning.  Having pain in lower abd, like at incision, been having for a wk. At night it is an 8/9, right now it is a 5. Has had diarrhea last 3 days, has already gone twice this morning, - had form, but loose.

## 2016-05-23 LAB — CULTURE, OB URINE

## 2016-05-23 LAB — GC/CHLAMYDIA PROBE AMP (~~LOC~~) NOT AT ARMC
CHLAMYDIA, DNA PROBE: NEGATIVE
Neisseria Gonorrhea: NEGATIVE

## 2016-05-25 ENCOUNTER — Encounter: Payer: Self-pay | Admitting: Student

## 2016-05-25 DIAGNOSIS — D509 Iron deficiency anemia, unspecified: Secondary | ICD-10-CM | POA: Insufficient documentation

## 2016-05-26 ENCOUNTER — Encounter: Payer: Self-pay | Admitting: Obstetrics and Gynecology

## 2016-06-01 IMAGING — CR DG CHEST 2V
2 series · 2 of 2 positions shown · non-contrast
Comparison: None.

CLINICAL DATA: Spider bite to the umbilicus at 4 p.m.. Lightheaded,
short of breath, and chest tightness. Smoker.

EXAM:
CHEST  2 VIEW

[w chest lat]
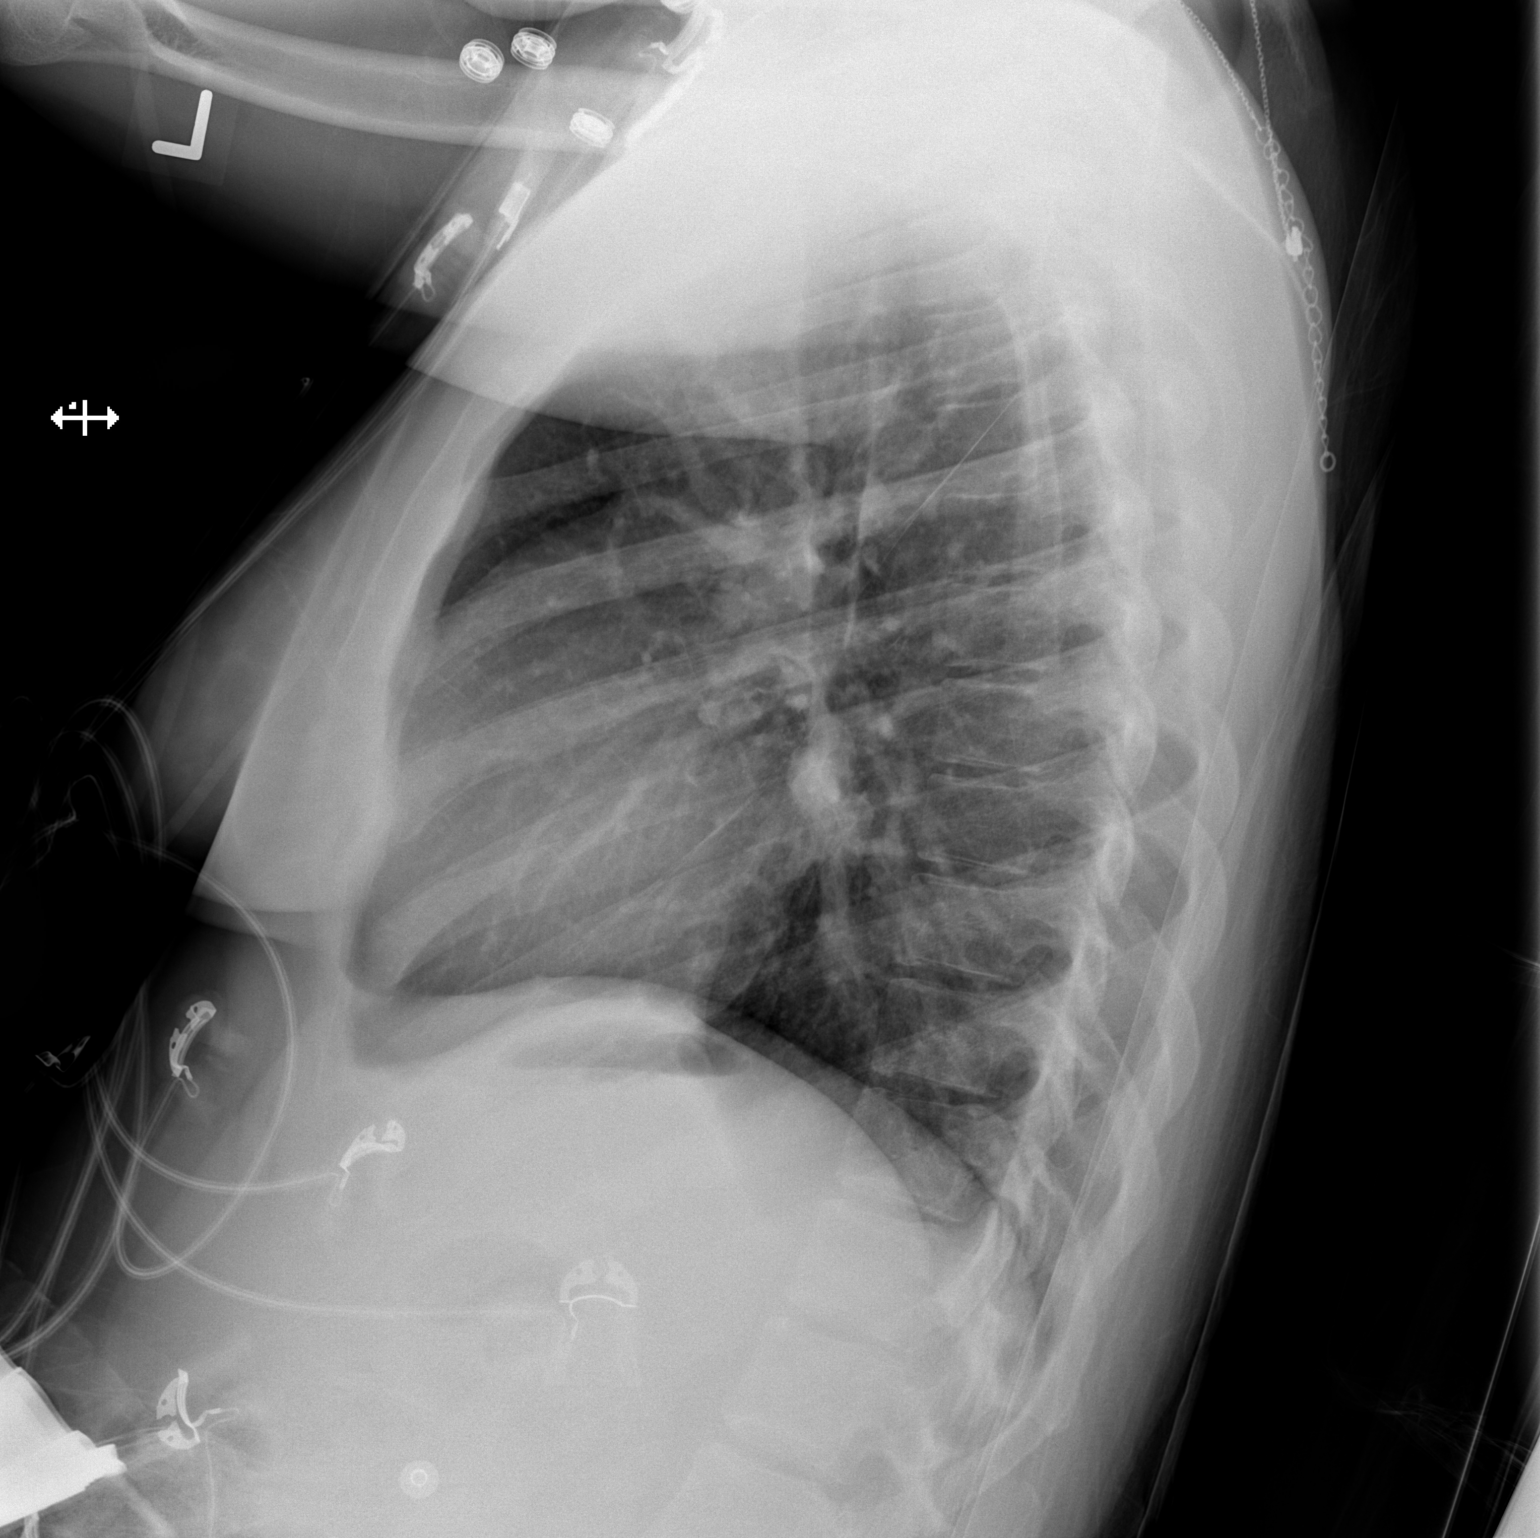

[x chest ap]
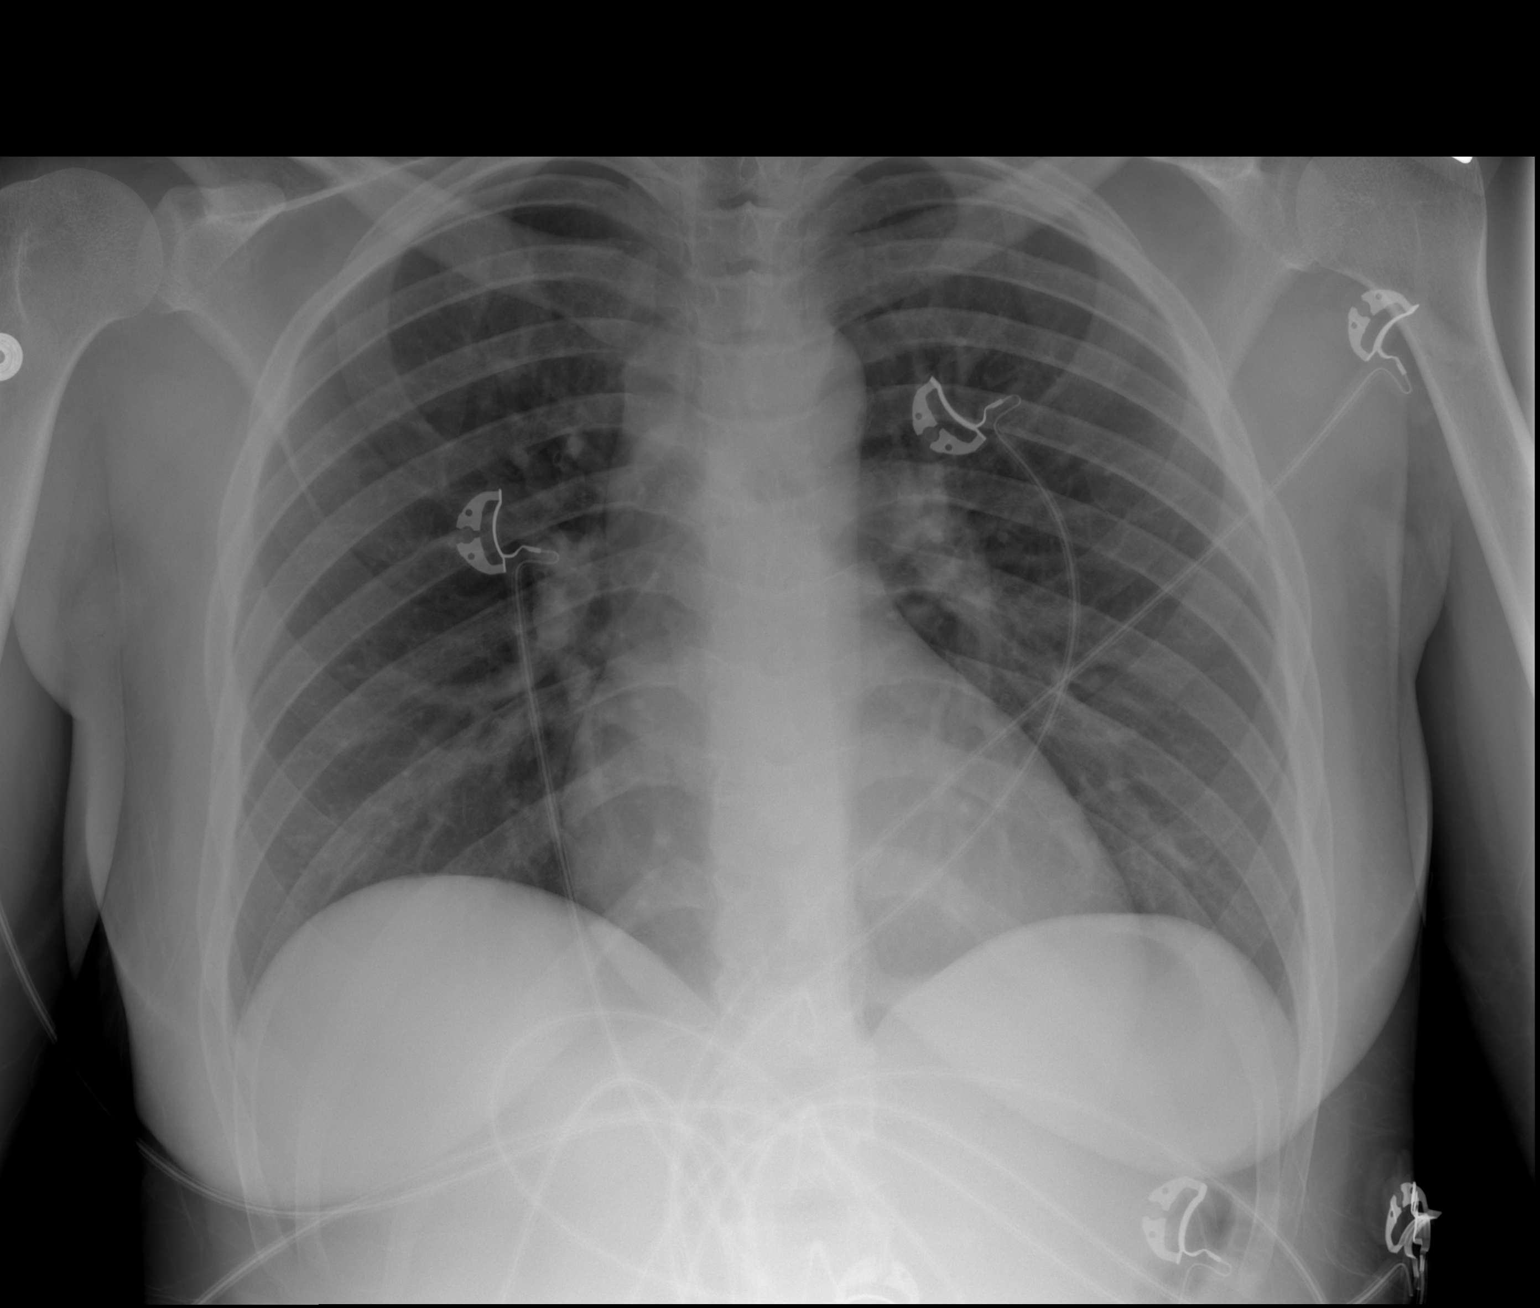

[2 of 2 positions shown; findings below may reference images not displayed]

FINDINGS: The heart size and mediastinal contours are within normal limits.
Both lungs are clear. The visualized skeletal structures are
unremarkable.
IMPRESSION: No active cardiopulmonary disease.

## 2016-06-08 ENCOUNTER — Encounter: Payer: Self-pay | Admitting: Certified Nurse Midwife

## 2016-07-27 ENCOUNTER — Encounter: Payer: Self-pay | Admitting: Obstetrics & Gynecology

## 2016-07-27 ENCOUNTER — Other Ambulatory Visit: Payer: Medicaid Other

## 2016-07-27 ENCOUNTER — Ambulatory Visit (INDEPENDENT_AMBULATORY_CARE_PROVIDER_SITE_OTHER): Payer: Medicaid Other | Admitting: Obstetrics & Gynecology

## 2016-07-27 ENCOUNTER — Other Ambulatory Visit (HOSPITAL_COMMUNITY)
Admission: RE | Admit: 2016-07-27 | Discharge: 2016-07-27 | Disposition: A | Discharge status: Home / Self Care | Payer: Medicaid Other | Source: Ambulatory Visit | Attending: Obstetrics & Gynecology | Admitting: Obstetrics & Gynecology

## 2016-07-27 DIAGNOSIS — Z113 Encounter for screening for infections with a predominantly sexual mode of transmission: Secondary | ICD-10-CM | POA: Diagnosis present

## 2016-07-27 DIAGNOSIS — Z3480 Encounter for supervision of other normal pregnancy, unspecified trimester: Secondary | ICD-10-CM | POA: Insufficient documentation

## 2016-07-27 DIAGNOSIS — Z01419 Encounter for gynecological examination (general) (routine) without abnormal findings: Secondary | ICD-10-CM | POA: Diagnosis present

## 2016-07-27 DIAGNOSIS — Z3403 Encounter for supervision of normal first pregnancy, third trimester: Secondary | ICD-10-CM | POA: Diagnosis not present
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
## 2016-07-27 NOTE — Progress Notes (Signed)
Patient presents for First OB visit. She is late to care.

## 2016-07-28 LAB — OBSTETRIC PANEL, INCLUDING HIV
Antibody Screen: NEGATIVE
BASOS ABS: 0 10*3/uL (ref 0.0–0.2)
Basos: 0 %
EOS (ABSOLUTE): 0.1 10*3/uL (ref 0.0–0.4)
Eos: 1 %
HEMATOCRIT: 30.8 % — AB (ref 34.0–46.6)
HIV SCREEN 4TH GENERATION: NONREACTIVE
Hemoglobin: 9.5 g/dL — ABNORMAL LOW (ref 11.1–15.9)
Hepatitis B Surface Ag: NEGATIVE
IMMATURE GRANS (ABS): 0 10*3/uL (ref 0.0–0.1)
Immature Granulocytes: 0 %
LYMPHS: 23 %
Lymphocytes Absolute: 2 10*3/uL (ref 0.7–3.1)
MCH: 21.7 pg — ABNORMAL LOW (ref 26.6–33.0)
MCHC: 30.8 g/dL — ABNORMAL LOW (ref 31.5–35.7)
MCV: 70 fL — AB (ref 79–97)
MONOCYTES: 10 %
Monocytes Absolute: 0.8 10*3/uL (ref 0.1–0.9)
NEUTROS ABS: 5.8 10*3/uL (ref 1.4–7.0)
Neutrophils: 66 %
PLATELETS: 234 10*3/uL (ref 150–379)
RBC: 4.38 x10E6/uL (ref 3.77–5.28)
RDW: 15.6 % — ABNORMAL HIGH (ref 12.3–15.4)
RPR: NONREACTIVE
Rh Factor: POSITIVE
Rubella Antibodies, IGG: 2.26 index (ref >0.99)
WBC: 8.7 10*3/uL (ref 3.4–10.8)

## 2016-07-28 LAB — CYTOLOGY - PAP
BACTERIAL VAGINITIS: POSITIVE — AB
CHLAMYDIA, DNA PROBE: NEGATIVE
Candida vaginitis: POSITIVE — AB
DIAGNOSIS: NEGATIVE
NEISSERIA GONORRHEA: NEGATIVE
Trichomonas: NEGATIVE

## 2016-07-28 LAB — GLUCOSE TOLERANCE, 2 HOURS W/ 1HR
GLUCOSE, 1 HOUR: 136 mg/dL (ref 65–179)
GLUCOSE, 2 HOUR: 129 mg/dL (ref 65–152)
Glucose, Fasting: 83 mg/dL (ref 65–91)

## 2016-07-28 LAB — HEMOGLOBINOPATHY EVALUATION
HEMOGLOBIN A2 QUANTITATION: 2.2 % (ref 1.8–3.2)
HGB A: 97.8 % (ref 96.4–98.8)
HGB C: 0 %
HGB S: 0 %
HGB VARIANT: 0 %
Hemoglobin F Quantitation: 0 % (ref 0.0–2.0)

## 2016-07-28 LAB — VARICELLA ZOSTER ANTIBODY, IGG: Varicella zoster IgG: 394 index (ref >165)

## 2016-07-29 LAB — CULTURE, OB URINE

## 2016-07-29 LAB — URINE CULTURE, OB REFLEX

## 2016-08-01 ENCOUNTER — Other Ambulatory Visit: Payer: Self-pay | Admitting: *Deleted

## 2016-08-01 ENCOUNTER — Encounter: Payer: Self-pay | Admitting: *Deleted

## 2016-08-01 DIAGNOSIS — B9689 Other specified bacterial agents as the cause of diseases classified elsewhere: Secondary | ICD-10-CM

## 2016-08-01 DIAGNOSIS — N760 Acute vaginitis: Principal | ICD-10-CM

## 2016-08-01 DIAGNOSIS — B3731 Acute candidiasis of vulva and vagina: Secondary | ICD-10-CM

## 2016-08-01 DIAGNOSIS — B373 Candidiasis of vulva and vagina: Secondary | ICD-10-CM

## 2016-08-01 MED ORDER — METRONIDAZOLE 500 MG PO TABS: 500.0000 mg | ORAL_TABLET | Freq: Two times a day (BID) | ORAL | 0 refills | Status: DC

## 2016-08-01 MED ORDER — FLUCONAZOLE 150 MG PO TABS: 150.0000 mg | ORAL_TABLET | Freq: Once | ORAL | 0 refills | Status: AC

## 2016-08-08 ENCOUNTER — Ambulatory Visit (HOSPITAL_COMMUNITY)
Admission: RE | Admit: 2016-08-08 | Discharge: 2016-08-08 | Disposition: A | Discharge status: Home / Self Care | Payer: Medicaid Other | Source: Ambulatory Visit | Attending: Obstetrics & Gynecology | Admitting: Obstetrics & Gynecology

## 2016-08-08 ENCOUNTER — Encounter (HOSPITAL_COMMUNITY): Payer: Self-pay

## 2016-08-08 DIAGNOSIS — Z3689 Encounter for other specified antenatal screening: Secondary | ICD-10-CM | POA: Insufficient documentation

## 2016-08-08 DIAGNOSIS — Z3403 Encounter for supervision of normal first pregnancy, third trimester: Secondary | ICD-10-CM

## 2016-08-08 DIAGNOSIS — Z3A20 20 weeks gestation of pregnancy: Secondary | ICD-10-CM | POA: Diagnosis not present

## 2016-08-08 DIAGNOSIS — O99333 Smoking (tobacco) complicating pregnancy, third trimester: Secondary | ICD-10-CM | POA: Insufficient documentation

## 2016-08-08 DIAGNOSIS — O0933 Supervision of pregnancy with insufficient antenatal care, third trimester: Secondary | ICD-10-CM | POA: Diagnosis not present

## 2016-08-08 DIAGNOSIS — O34211 Maternal care for low transverse scar from previous cesarean delivery: Secondary | ICD-10-CM | POA: Diagnosis not present

## 2016-08-10 ENCOUNTER — Encounter: Payer: Medicaid Other | Admitting: Obstetrics & Gynecology

## 2016-08-19 ENCOUNTER — Encounter (HOSPITAL_COMMUNITY): Payer: Self-pay

## 2016-08-19 ENCOUNTER — Emergency Department (HOSPITAL_COMMUNITY): Payer: Medicaid Other

## 2016-08-19 ENCOUNTER — Emergency Department (HOSPITAL_COMMUNITY)
Admission: EM | Admit: 2016-08-19 | Discharge: 2016-08-20 | Disposition: A | Discharge status: Home / Self Care | Payer: Medicaid Other | Attending: Emergency Medicine | Admitting: Emergency Medicine

## 2016-08-19 DIAGNOSIS — F17210 Nicotine dependence, cigarettes, uncomplicated: Secondary | ICD-10-CM | POA: Insufficient documentation

## 2016-08-19 DIAGNOSIS — Z3A32 32 weeks gestation of pregnancy: Secondary | ICD-10-CM | POA: Diagnosis not present

## 2016-08-19 DIAGNOSIS — O99333 Smoking (tobacco) complicating pregnancy, third trimester: Secondary | ICD-10-CM | POA: Diagnosis not present

## 2016-08-19 DIAGNOSIS — O99513 Diseases of the respiratory system complicating pregnancy, third trimester: Secondary | ICD-10-CM | POA: Diagnosis not present

## 2016-08-19 DIAGNOSIS — R059 Cough, unspecified: Secondary | ICD-10-CM

## 2016-08-19 DIAGNOSIS — K219 Gastro-esophageal reflux disease without esophagitis: Secondary | ICD-10-CM | POA: Insufficient documentation

## 2016-08-19 DIAGNOSIS — Z3493 Encounter for supervision of normal pregnancy, unspecified, third trimester: Secondary | ICD-10-CM

## 2016-08-19 DIAGNOSIS — O99613 Diseases of the digestive system complicating pregnancy, third trimester: Secondary | ICD-10-CM | POA: Diagnosis not present

## 2016-08-19 DIAGNOSIS — R05 Cough: Secondary | ICD-10-CM | POA: Diagnosis not present

## 2016-08-19 LAB — CBC
HCT: 29.3 % — ABNORMAL LOW (ref 36.0–46.0)
Hemoglobin: 9.2 g/dL — ABNORMAL LOW (ref 12.0–15.0)
MCH: 21.6 pg — AB (ref 26.0–34.0)
MCHC: 31.4 g/dL (ref 30.0–36.0)
MCV: 68.9 fL — ABNORMAL LOW (ref 78.0–100.0)
PLATELETS: 202 10*3/uL (ref 150–400)
RBC: 4.25 MIL/uL (ref 3.87–5.11)
RDW: 15.4 % (ref 11.5–15.5)
WBC: 8 10*3/uL (ref 4.0–10.5)

## 2016-08-19 LAB — BASIC METABOLIC PANEL
Anion gap: 8 (ref 5–15)
BUN: 8 mg/dL (ref 6–20)
CALCIUM: 8.9 mg/dL (ref 8.9–10.3)
CO2: 23 mmol/L (ref 22–32)
CREATININE: 0.51 mg/dL (ref 0.44–1.00)
Chloride: 105 mmol/L (ref 101–111)
Glucose, Bld: 91 mg/dL (ref 65–99)
Potassium: 3.5 mmol/L (ref 3.5–5.1)
SODIUM: 136 mmol/L (ref 135–145)

## 2016-08-19 LAB — I-STAT TROPONIN, ED: TROPONIN I, POC: 0 ng/mL (ref 0.00–0.08)

## 2016-08-19 NOTE — ED Triage Notes (Signed)
Pt states that for the past three weeks she has been having a productive cough, worse at nighttime, makes her feel SOB with chest tightness. Pt is [redacted] weeks pregnant.

## 2016-08-20 MED ORDER — IPRATROPIUM-ALBUTEROL 0.5-2.5 (3) MG/3ML IN SOLN
3.0000 mL | Freq: Once | RESPIRATORY_TRACT | Status: AC
  Administered 2016-08-20: 3 mL via RESPIRATORY_TRACT
  Filled 2016-08-20: qty 3

## 2016-08-20 MED ORDER — RANITIDINE HCL 150 MG PO TABS: 150.0000 mg | ORAL_TABLET | Freq: Two times a day (BID) | ORAL | 0 refills | Status: DC

## 2016-08-20 MED ORDER — FAMOTIDINE 20 MG PO TABS
20.0000 mg | ORAL_TABLET | Freq: Once | ORAL | Status: AC
  Administered 2016-08-20: 20 mg via ORAL
  Filled 2016-08-20: qty 1

## 2016-08-20 NOTE — Progress Notes (Addendum)
Called to see patient, in no acute distress. To ER for cough and chest pain. Pt 32 weeks, denies contraction or bleeding. Placed on fetal monitor. Will monitor.  Called Dr Shawnie PonsPratt, tracing reviewed, OB cleared.
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
## 2016-08-20 NOTE — Discharge Instructions (Signed)
It may take several days on the ranitidine (Zantac) to see if it will be helpful. It may be helpful to raise the head of your bed, or to sleep on a couple of pillows. Return to the ED if symptoms are getting worse.

## 2016-08-24 ENCOUNTER — Encounter: Payer: Medicaid Other | Admitting: Obstetrics and Gynecology

## 2016-08-29 ENCOUNTER — Encounter: Payer: Medicaid Other | Admitting: Certified Nurse Midwife

## 2016-08-29 ENCOUNTER — Ambulatory Visit (INDEPENDENT_AMBULATORY_CARE_PROVIDER_SITE_OTHER): Payer: Medicaid Other | Admitting: Obstetrics

## 2016-08-29 ENCOUNTER — Encounter: Payer: Self-pay | Admitting: Obstetrics

## 2016-08-29 VITALS — BP 127/69 | HR 109 | Temp 97.8°F | Wt 175.6 lb

## 2016-08-29 DIAGNOSIS — Z3483 Encounter for supervision of other normal pregnancy, third trimester: Secondary | ICD-10-CM

## 2016-08-29 DIAGNOSIS — Z3480 Encounter for supervision of other normal pregnancy, unspecified trimester: Secondary | ICD-10-CM

## 2016-08-29 DIAGNOSIS — Z3403 Encounter for supervision of normal first pregnancy, third trimester: Secondary | ICD-10-CM

## 2016-08-29 NOTE — Progress Notes (Signed)
Subjective:  Cathy Elliott is a 26 y.o. [id number] at 7969w2d being seen today for ongoing prenatal care.  She is currently monitored for the following issues for this low-risk pregnancy and has Smoker; H/O cesarean section; Anemia, iron deficiency; and Supervision of normal first pregnancy in third trimester on her problem list.  Patient reports no complaints.  Contractions: Not present. Vag. Bleeding: None.  Movement: Present. Denies leaking of fluid.   The following portions of the patient's history were reviewed and updated as appropriate: allergies, current medications, past family history, past medical history, past social history, past surgical history and problem list. Problem list updated.  Objective:   Vitals:   08/29/16 1414  BP: 127/69  Pulse: (!) 109  Temp: 97.8 F (36.6 C)  Weight: 175 lb 9.6 oz (79.7 kg)    Fetal Status: Fetal Heart Rate (bpm): 140   Movement: Present     General:  Alert, oriented and cooperative. Patient is in no acute distress.  Skin: Skin is warm and dry. No rash noted.   Cardiovascular: Normal heart rate noted  Respiratory: Normal respiratory effort, no problems with respiration noted  Abdomen: Soft, gravid, appropriate for gestational age. Pain/Pressure: Present     Pelvic:  Cervical exam deferred        Extremities: Normal range of motion.  Edema: Trace  Mental Status: Normal mood and affect. Normal behavior. Normal judgment and thought content.   Urinalysis:      Assessment and Plan:  Pregnancy: [id number] at 2669w2d  1. Supervision of normal first pregnancy in third trimester   Preterm labor symptoms and general obstetric precautions including but not limited to vaginal bleeding, contractions, leaking of fluid and fetal movement were reviewed in detail with the patient. Please refer to After Visit Summary for other counseling recommendations.  Return in 2 weeks (on 09/12/2016).   Brock Badharles A Harper, MDPatient ID: Cathy Elliott,  female   DOB: 02/08/1991, 26 y.o.   MRN: [id number]

## 2016-08-29 NOTE — Progress Notes (Signed)
Patient reports good fetal movement, denies contractions, advised of need of iron supplements.

## 2016-09-12 ENCOUNTER — Encounter: Payer: Medicaid Other | Admitting: Obstetrics

## 2016-09-13 ENCOUNTER — Ambulatory Visit (INDEPENDENT_AMBULATORY_CARE_PROVIDER_SITE_OTHER): Payer: Medicaid Other | Admitting: Obstetrics and Gynecology

## 2016-09-13 ENCOUNTER — Other Ambulatory Visit (HOSPITAL_COMMUNITY)
Admission: RE | Admit: 2016-09-13 | Discharge: 2016-09-13 | Disposition: A | Discharge status: Home / Self Care | Payer: Medicaid Other | Source: Ambulatory Visit | Attending: Obstetrics and Gynecology | Admitting: Obstetrics and Gynecology

## 2016-09-13 VITALS — BP 111/62 | HR 76 | Wt 178.0 lb

## 2016-09-13 DIAGNOSIS — Z3A35 35 weeks gestation of pregnancy: Secondary | ICD-10-CM | POA: Diagnosis not present

## 2016-09-13 DIAGNOSIS — D509 Iron deficiency anemia, unspecified: Secondary | ICD-10-CM

## 2016-09-13 DIAGNOSIS — Z3403 Encounter for supervision of normal first pregnancy, third trimester: Secondary | ICD-10-CM | POA: Insufficient documentation

## 2016-09-13 DIAGNOSIS — Z98891 History of uterine scar from previous surgery: Secondary | ICD-10-CM

## 2016-09-13 NOTE — Progress Notes (Signed)
Patient reports good fetal movement and possible irregular contractions. Pt states she has not been taking iron supplements b/c they make her sick.

## 2016-09-13 NOTE — Patient Instructions (Signed)
Third Trimester of Pregnancy The third trimester is from week 28 through week 40 (months 7 through 9). The third trimester is a time when the unborn baby (fetus) is growing rapidly. At the end of the ninth month, the fetus is about 20 inches in length and weighs 6-10 pounds. Body changes during your third trimester Your body will continue to go through many changes during pregnancy. The changes vary from woman to woman. During the third trimester:  Your weight will continue to increase. You can expect to gain 25-35 pounds (11-16 kg) by the end of the pregnancy.  You may begin to get stretch marks on your hips, abdomen, and breasts.  You may urinate more often because the fetus is moving lower into your pelvis and pressing on your bladder.  You may develop or continue to have heartburn. This is caused by increased hormones that slow down muscles in the digestive tract.  You may develop or continue to have constipation because increased hormones slow digestion and cause the muscles that push waste through your intestines to relax.  You may develop hemorrhoids. These are swollen veins (varicose veins) in the rectum that can itch or be painful.  You may develop swollen, bulging veins (varicose veins) in your legs.  You may have increased body aches in the pelvis, back, or thighs. This is due to weight gain and increased hormones that are relaxing your joints.  You may have changes in your hair. These can include thickening of your hair, rapid growth, and changes in texture. Some women also have hair loss during or after pregnancy, or hair that feels dry or thin. Your hair will most likely return to normal after your baby is born.  Your breasts will continue to grow and they will continue to become tender. A yellow fluid (colostrum) may leak from your breasts. This is the first milk you are producing for your baby.  Your belly button may stick out.  You may notice more swelling in your hands,  face, or ankles.  You may have increased tingling or numbness in your hands, arms, and legs. The skin on your belly may also feel numb.  You may feel short of breath because of your expanding uterus.  You may have more problems sleeping. This can be caused by the size of your belly, increased need to urinate, and an increase in your body's metabolism.  You may notice the fetus "dropping," or moving lower in your abdomen (lightening).  You may have increased vaginal discharge.  You may notice your joints feel loose and you may have pain around your pelvic bone.  What to expect at prenatal visits You will have prenatal exams every 2 weeks until week 36. Then you will have weekly prenatal exams. During a routine prenatal visit:  You will be weighed to make sure you and the baby are growing normally.  Your blood pressure will be taken.  Your abdomen will be measured to track your baby's growth.  The fetal heartbeat will be listened to.  Any test results from the previous visit will be discussed.  You may have a cervical check near your due date to see if your cervix has softened or thinned (effaced).  You will be tested for Group B streptococcus. This happens between 35 and 37 weeks.  Your health care provider may ask you:  What your birth plan is.  How you are feeling.  If you are feeling the baby move.  If you have had   any abnormal symptoms, such as leaking fluid, bleeding, severe headaches, or abdominal cramping.  If you are using any tobacco products, including cigarettes, chewing tobacco, and electronic cigarettes.  If you have any questions.  Other tests or screenings that may be performed during your third trimester include:  Blood tests that check for low iron levels (anemia).  Fetal testing to check the health, activity level, and growth of the fetus. Testing is done if you have certain medical conditions or if there are problems during the  pregnancy.  Nonstress test (NST). This test checks the health of your baby to make sure there are no signs of problems, such as the baby not getting enough oxygen. During this test, a belt is placed around your belly. The baby is made to move, and its heart rate is monitored during movement.  What is false labor? False labor is a condition in which you feel small, irregular tightenings of the muscles in the womb (contractions) that usually go away with rest, changing position, or drinking water. These are called Braxton Hicks contractions. Contractions may last for hours, days, or even weeks before true labor sets in. If contractions come at regular intervals, become more frequent, increase in intensity, or become painful, you should see your health care provider. What are the signs of labor?  Abdominal cramps.  Regular contractions that start at 10 minutes apart and become stronger and more frequent with time.  Contractions that start on the top of the uterus and spread down to the lower abdomen and back.  Increased pelvic pressure and dull back pain.  A watery or bloody mucus discharge that comes from the vagina.  Leaking of amniotic fluid. This is also known as your "water breaking." It could be a slow trickle or a gush. Let your health care provider know if it has a color or strange odor. If you have any of these signs, call your health care provider right away, even if it is before your due date. Follow these instructions at home: Medicines  Follow your health care provider's instructions regarding medicine use. Specific medicines may be either safe or unsafe to take during pregnancy.  Take a prenatal vitamin that contains at least 600 micrograms (mcg) of folic acid.  If you develop constipation, try taking a stool softener if your health care provider approves. Eating and drinking  Eat a balanced diet that includes fresh fruits and vegetables, whole grains, good sources of protein  such as meat, eggs, or tofu, and low-fat dairy. Your health care provider will help you determine the amount of weight gain that is right for you.  Avoid raw meat and uncooked cheese. These carry germs that can cause birth defects in the baby.  If you have low calcium intake from food, talk to your health care provider about whether you should take a daily calcium supplement.  Eat four or five small meals rather than three large meals a day.  Limit foods that are high in fat and processed sugars, such as fried and sweet foods.  To prevent constipation: ? Drink enough fluid to keep your urine clear or pale yellow. ? Eat foods that are high in fiber, such as fresh fruits and vegetables, whole grains, and beans. Activity  Exercise only as directed by your health care provider. Most women can continue their usual exercise routine during pregnancy. Try to exercise for 30 minutes at least 5 days a week. Stop exercising if you experience uterine contractions.  Avoid heavy   lifting.  Do not exercise in extreme heat or humidity, or at high altitudes.  Wear low-heel, comfortable shoes.  Practice good posture.  You may continue to have sex unless your health care provider tells you otherwise. Relieving pain and discomfort  Take frequent breaks and rest with your legs elevated if you have leg cramps or low back pain.  Take warm sitz baths to soothe any pain or discomfort caused by hemorrhoids. Use hemorrhoid cream if your health care provider approves.  Wear a good support bra to prevent discomfort from breast tenderness.  If you develop varicose veins: ? Wear support pantyhose or compression stockings as told by your healthcare provider. ? Elevate your feet for 15 minutes, 3-4 times a day. Prenatal care  Write down your questions. Take them to your prenatal visits.  Keep all your prenatal visits as told by your health care provider. This is important. Safety  Wear your seat belt at  all times when driving.  Make a list of emergency phone numbers, including numbers for family, friends, the hospital, and police and fire departments. General instructions  Avoid cat litter boxes and soil used by cats. These carry germs that can cause birth defects in the baby. If you have a cat, ask someone to clean the litter box for you.  Do not travel far distances unless it is absolutely necessary and only with the approval of your health care provider.  Do not use hot tubs, steam rooms, or saunas.  Do not drink alcohol.  Do not use any products that contain nicotine or tobacco, such as cigarettes and e-cigarettes. If you need help quitting, ask your health care provider.  Do not use any medicinal herbs or unprescribed drugs. These chemicals affect the formation and growth of the baby.  Do not douche or use tampons or scented sanitary pads.  Do not cross your legs for long periods of time.  To prepare for the arrival of your baby: ? Take prenatal classes to understand, practice, and ask questions about labor and delivery. ? Make a trial run to the hospital. ? Visit the hospital and tour the maternity area. ? Arrange for maternity or paternity leave through employers. ? Arrange for family and friends to take care of pets while you are in the hospital. ? Purchase a rear-facing car seat and make sure you know how to install it in your car. ? Pack your hospital bag. ? Prepare the baby's nursery. Make sure to remove all pillows and stuffed animals from the baby's crib to prevent suffocation.  Visit your dentist if you have not gone during your pregnancy. Use a soft toothbrush to brush your teeth and be gentle when you floss. Contact a health care provider if:  You are unsure if you are in labor or if your water has broken.  You become dizzy.  You have mild pelvic cramps, pelvic pressure, or nagging pain in your abdominal area.  You have lower back pain.  You have persistent  nausea, vomiting, or diarrhea.  You have an unusual or bad smelling vaginal discharge.  You have pain when you urinate. Get help right away if:  Your water breaks before 37 weeks.  You have regular contractions less than 5 minutes apart before 37 weeks.  You have a fever.  You are leaking fluid from your vagina.  You have spotting or bleeding from your vagina.  You have severe abdominal pain or cramping.  You have rapid weight loss or weight gain.    You have shortness of breath with chest pain.  You notice sudden or extreme swelling of your face, hands, ankles, feet, or legs.  Your baby makes fewer than 10 movements in 2 hours.  You have severe headaches that do not go away when you take medicine.  You have vision changes. Summary  The third trimester is from week 28 through week 40, months 7 through 9. The third trimester is a time when the unborn baby (fetus) is growing rapidly.  During the third trimester, your discomfort may increase as you and your baby continue to gain weight. You may have abdominal, leg, and back pain, sleeping problems, and an increased need to urinate.  During the third trimester your breasts will keep growing and they will continue to become tender. A yellow fluid (colostrum) may leak from your breasts. This is the first milk you are producing for your baby.  False labor is a condition in which you feel small, irregular tightenings of the muscles in the womb (contractions) that eventually go away. These are called Braxton Hicks contractions. Contractions may last for hours, days, or even weeks before true labor sets in.  Signs of labor can include: abdominal cramps; regular contractions that start at 10 minutes apart and become stronger and more frequent with time; watery or bloody mucus discharge that comes from the vagina; increased pelvic pressure and dull back pain; and leaking of amniotic fluid. This information is not intended to replace advice  given to you by your health care provider. Make sure you discuss any questions you have with your health care provider. Document Released: 05/17/2001 Document Revised: 10/29/2015 Document Reviewed: 07/24/2012 [employer] Interactive Patient Education  2017 [employer]  

## 2016-09-13 NOTE — Progress Notes (Signed)
Subjective:  Cathy Elliott is a 26 y.o. [id number] at [redacted]w[redacted]d being seen today for ongoing prenatal care.  She is currently monitored for the following issues for this low-risk pregnancy and has Smoker; H/O cesarean section; Anemia, iron deficiency; and Supervision of normal first pregnancy in third trimester on her problem list.  Patient reports no complaints.  Contractions: Irregular. Vag. Bleeding: None.  Movement: Present. Denies leaking of fluid.   The following portions of the patient's history were reviewed and updated as appropriate: allergies, current medications, past family history, past medical history, past social history, past surgical history and problem list. Problem list updated.  Objective:   Vitals:   09/13/16 1106  BP: 111/62  Pulse: 76  Weight: 178 lb (80.7 kg)    Fetal Status: Fetal Heart Rate (bpm): 154   Movement: Present     General:  Alert, oriented and cooperative. Patient is in no acute distress.  Skin: Skin is warm and dry. No rash noted.   Cardiovascular: Normal heart rate noted  Respiratory: Normal respiratory effort, no problems with respiration noted  Abdomen: Soft, gravid, appropriate for gestational age. Pain/Pressure: Absent     Pelvic:  Cervical exam performed        Extremities: Normal range of motion.  Edema: Trace  Mental Status: Normal mood and affect. Normal behavior. Normal judgment and thought content.   Urinalysis:      Assessment and Plan:  Pregnancy: [id number] at [redacted]w[redacted]d  1. Supervision of normal first pregnancy in third trimester Stable - Strep Gp B NAA - GC/Chlamydia probe amp ([place])not at [facility]  2. Iron deficiency anemia, unspecified iron deficiency anemia type Samples of Fusion Plus provided to pt  3. H/O cesarean section Repeat at 39 weeks  Preterm labor symptoms and general obstetric precautions including but not limited to vaginal bleeding, contractions, leaking of fluid and fetal movement were reviewed in detail  with the patient. Please refer to After Visit Summary for other counseling recommendations.  Return in about 1 week (around 09/20/2016) for OB visit.   Hermina Staggers, MD

## 2016-09-14 LAB — GC/CHLAMYDIA PROBE AMP (~~LOC~~) NOT AT ARMC
CHLAMYDIA, DNA PROBE: NEGATIVE
NEISSERIA GONORRHEA: NEGATIVE

## 2016-09-15 ENCOUNTER — Encounter (HOSPITAL_COMMUNITY): Payer: Self-pay

## 2016-09-15 LAB — STREP GP B NAA: Strep Gp B NAA: NEGATIVE

## 2016-09-20 ENCOUNTER — Encounter: Payer: Medicaid Other | Admitting: Certified Nurse Midwife

## 2016-09-21 ENCOUNTER — Encounter: Payer: Medicaid Other | Admitting: Certified Nurse Midwife

## 2016-09-27 ENCOUNTER — Encounter: Payer: Medicaid Other | Admitting: Certified Nurse Midwife

## 2016-09-29 ENCOUNTER — Telehealth: Payer: Self-pay | Admitting: *Deleted

## 2016-09-29 NOTE — Telephone Encounter (Signed)
Patient is having a really hard time with her reflux to the point of nausea after eating. She is using TUMS with out relief. Patient states the Zantac made her sick.

## 2016-09-30 ENCOUNTER — Other Ambulatory Visit: Payer: Self-pay | Admitting: Obstetrics

## 2016-09-30 DIAGNOSIS — K219 Gastro-esophageal reflux disease without esophagitis: Secondary | ICD-10-CM

## 2016-09-30 MED ORDER — OMEPRAZOLE 20 MG PO CPDR: 20.0000 mg | DELAYED_RELEASE_CAPSULE | Freq: Two times a day (BID) | ORAL | 11 refills | Status: DC

## 2016-09-30 NOTE — Telephone Encounter (Signed)
Prilosec Rx for GERD

## 2016-10-05 ENCOUNTER — Encounter (HOSPITAL_COMMUNITY): Payer: Self-pay | Admitting: Family Medicine

## 2016-10-05 ENCOUNTER — Encounter (HOSPITAL_COMMUNITY): Payer: Self-pay
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
## 2016-10-06 ENCOUNTER — Encounter: Payer: Self-pay | Admitting: Certified Nurse Midwife

## 2016-10-06 ENCOUNTER — Ambulatory Visit (INDEPENDENT_AMBULATORY_CARE_PROVIDER_SITE_OTHER): Payer: Medicaid Other | Admitting: Certified Nurse Midwife

## 2016-10-06 VITALS — BP 116/74 | HR 96 | Wt 175.0 lb

## 2016-10-06 DIAGNOSIS — Z3483 Encounter for supervision of other normal pregnancy, third trimester: Secondary | ICD-10-CM

## 2016-10-06 DIAGNOSIS — Z3480 Encounter for supervision of other normal pregnancy, unspecified trimester: Secondary | ICD-10-CM

## 2016-10-06 DIAGNOSIS — D509 Iron deficiency anemia, unspecified: Secondary | ICD-10-CM

## 2016-10-06 DIAGNOSIS — Z98891 History of uterine scar from previous surgery: Secondary | ICD-10-CM

## 2016-10-06 NOTE — Progress Notes (Signed)
Pt states that she is having leg cramps.

## 2016-10-06 NOTE — Progress Notes (Signed)
   PRENATAL VISIT NOTE  Subjective:  Cathy Elliott is a 26 y.o. [id number] at 8569w5d being seen today for ongoing prenatal care.  She is currently monitored for the following issues for this low-risk pregnancy and has Smoker; H/O cesarean section; Anemia, iron deficiency; and Supervision of other normal pregnancy, antepartum on her problem list.  Patient reports no complaints.  Contractions: Irregular. Vag. Bleeding: None.  Movement: Present. Denies leaking of fluid.   The following portions of the patient's history were reviewed and updated as appropriate: allergies, current medications, past family history, past medical history, past social history, past surgical history and problem list. Problem list updated.  Objective:   Vitals:   10/06/16 1356  BP: 116/74  Pulse: 96  Weight: 175 lb (79.4 kg)    Fetal Status: Fetal Heart Rate (bpm): 148 Fundal Height: 38 cm Movement: Present     General:  Alert, oriented and cooperative. Patient is in no acute distress.  Skin: Skin is warm and dry. No rash noted.   Cardiovascular: Normal heart rate noted  Respiratory: Normal respiratory effort, no problems with respiration noted  Abdomen: Soft, gravid, appropriate for gestational age. Pain/Pressure: Present     Pelvic:  Cervical exam deferred        Extremities: Normal range of motion.     Mental Status: Normal mood and affect. Normal behavior. Normal judgment and thought content.   Assessment and Plan:  Pregnancy: [id number] at 2269w5d  1. H/O cesarean section     Repeat C-section scheduled for 10/12/16.  Declines BTL at this time.   2. Iron deficiency anemia, unspecified iron deficiency anemia type     Taking OTC iron.   3. Supervision of other normal pregnancy in third trimester     Doing well.  Nexplanon ordered/form completed.   Term labor symptoms and general obstetric precautions including but not limited to vaginal bleeding, contractions, leaking of fluid and fetal movement were  reviewed in detail with the patient. Please refer to After Visit Summary for other counseling recommendations.  Return in about 4 weeks (around 11/03/2016) for Postpartum.   Roe Coombsachelle A Ruhee Enck, CNM

## 2016-10-10 NOTE — Patient Instructions (Signed)
20 Gardiner RhymeKeydra M Cardwell-Davis  10/10/2016   Your procedure is scheduled on:  10/12/2016  [facility] at 0740 AM.  Pick up the phone at the desk and dial [phone].   Call this number if you have problems the morning of surgery: [phone]   Remember:   Do not eat food:After Midnight.  Do not drink clear liquids: After Midnight.  Take these medicines the morning of surgery with A SIP OF WATER: none   Do not wear jewelry, make-up or nail polish.  Do not wear lotions, powders, or perfumes. Do not wear deodorant.  Do not shave 48 hours prior to surgery.  Do not bring valuables to the hospital.  [facility] is not   responsible for any belongings or valuables brought to the hospital.  Contacts, dentures or bridgework may not be worn into surgery.  Leave suitcase in the car. After surgery it may be brought to your room.  For patients admitted to the hospital, checkout time is 11:00 AM the day of              discharge.   Patients discharged the day of surgery will not be allowed to drive             home.  Name and phone number of your driver: na  Special Instructions:   N/A   Please read over the following fact sheets that you were given:   Surgical Site Infection Prevention

## 2016-10-11 ENCOUNTER — Encounter (HOSPITAL_COMMUNITY)
Admission: RE | Admit: 2016-10-11 | Discharge: 2016-10-11 | Disposition: A | Discharge status: Home / Self Care | Payer: Medicaid Other | Source: Ambulatory Visit | Attending: Family Medicine | Admitting: Family Medicine

## 2016-10-11 LAB — CBC
HEMATOCRIT: 29.8 % — AB (ref 36.0–46.0)
HEMOGLOBIN: 9.3 g/dL — AB (ref 12.0–15.0)
MCH: 21.2 pg — ABNORMAL LOW (ref 26.0–34.0)
MCHC: 31.2 g/dL (ref 30.0–36.0)
MCV: 68 fL — AB (ref 78.0–100.0)
Platelets: 196 10*3/uL (ref 150–400)
RBC: 4.38 MIL/uL (ref 3.87–5.11)
RDW: 16.9 % — ABNORMAL HIGH (ref 11.5–15.5)
WBC: 7.8 10*3/uL (ref 4.0–10.5)

## 2016-10-11 LAB — TYPE AND SCREEN
ABO/RH(D): A POS
Antibody Screen: NEGATIVE

## 2016-10-12 ENCOUNTER — Inpatient Hospital Stay (HOSPITAL_COMMUNITY)
Admission: RE | Admit: 2016-10-12 | Discharge: 2016-10-14 | DRG: 765 | Disposition: A | Discharge status: Home / Self Care | Payer: Medicaid Other | Source: Ambulatory Visit | Attending: Family Medicine | Admitting: Family Medicine

## 2016-10-12 ENCOUNTER — Inpatient Hospital Stay (HOSPITAL_COMMUNITY): Payer: Medicaid Other | Admitting: Anesthesiology

## 2016-10-12 ENCOUNTER — Encounter (HOSPITAL_COMMUNITY): Payer: Self-pay

## 2016-10-12 ENCOUNTER — Encounter (HOSPITAL_COMMUNITY)
Admission: RE | Disposition: A | Discharge status: Home / Self Care | Payer: Self-pay | Source: Ambulatory Visit | Attending: Family Medicine

## 2016-10-12 DIAGNOSIS — D509 Iron deficiency anemia, unspecified: Secondary | ICD-10-CM | POA: Diagnosis present

## 2016-10-12 DIAGNOSIS — Z3493 Encounter for supervision of normal pregnancy, unspecified, third trimester: Secondary | ICD-10-CM | POA: Diagnosis present

## 2016-10-12 DIAGNOSIS — F17210 Nicotine dependence, cigarettes, uncomplicated: Secondary | ICD-10-CM | POA: Diagnosis present

## 2016-10-12 DIAGNOSIS — D62 Acute posthemorrhagic anemia: Secondary | ICD-10-CM | POA: Diagnosis not present

## 2016-10-12 DIAGNOSIS — Z3480 Encounter for supervision of other normal pregnancy, unspecified trimester: Secondary | ICD-10-CM

## 2016-10-12 DIAGNOSIS — F17200 Nicotine dependence, unspecified, uncomplicated: Secondary | ICD-10-CM | POA: Diagnosis present

## 2016-10-12 DIAGNOSIS — Z3A39 39 weeks gestation of pregnancy: Secondary | ICD-10-CM

## 2016-10-12 DIAGNOSIS — O99824 Streptococcus B carrier state complicating childbirth: Secondary | ICD-10-CM | POA: Diagnosis present

## 2016-10-12 DIAGNOSIS — O99334 Smoking (tobacco) complicating childbirth: Secondary | ICD-10-CM | POA: Diagnosis present

## 2016-10-12 DIAGNOSIS — Z98891 History of uterine scar from previous surgery: Secondary | ICD-10-CM

## 2016-10-12 DIAGNOSIS — O9902 Anemia complicating childbirth: Secondary | ICD-10-CM | POA: Diagnosis present

## 2016-10-12 DIAGNOSIS — O34211 Maternal care for low transverse scar from previous cesarean delivery: Principal | ICD-10-CM | POA: Diagnosis present

## 2016-10-12 DIAGNOSIS — O9081 Anemia of the puerperium: Secondary | ICD-10-CM | POA: Diagnosis not present

## 2016-10-12 DIAGNOSIS — Z3A38 38 weeks gestation of pregnancy: Secondary | ICD-10-CM

## 2016-10-12 LAB — CBC
HCT: 27.4 % — ABNORMAL LOW (ref 36.0–46.0)
HCT: 29.6 % — ABNORMAL LOW (ref 36.0–46.0)
Hemoglobin: 8.7 g/dL — ABNORMAL LOW (ref 12.0–15.0)
Hemoglobin: 9.4 g/dL — ABNORMAL LOW (ref 12.0–15.0)
MCH: 21.4 pg — ABNORMAL LOW (ref 26.0–34.0)
MCH: 21.4 pg — AB (ref 26.0–34.0)
MCHC: 31.8 g/dL (ref 30.0–36.0)
MCHC: 31.8 g/dL (ref 30.0–36.0)
MCV: 67.5 fL — ABNORMAL LOW (ref 78.0–100.0)
MCV: 67.4 fL — AB (ref 78.0–100.0)
PLATELETS: 188 10*3/uL (ref 150–400)
PLATELETS: 183 10*3/uL (ref 150–400)
RBC: 4.06 MIL/uL (ref 3.87–5.11)
RBC: 4.39 MIL/uL (ref 3.87–5.11)
RDW: 16.9 % — AB (ref 11.5–15.5)
RDW: 16.9 % — ABNORMAL HIGH (ref 11.5–15.5)
WBC: 7.4 10*3/uL (ref 4.0–10.5)
WBC: 11.5 10*3/uL — ABNORMAL HIGH (ref 4.0–10.5)

## 2016-10-12 LAB — RPR: RPR Ser Ql: NONREACTIVE

## 2016-10-12 SURGERY — Surgical Case
Anesthesia: Spinal | Site: Abdomen | Wound class: Clean Contaminated

## 2016-10-12 MED ORDER — DEXTROSE 5 % IV SOLN
1.0000 ug/kg/h | INTRAVENOUS | Status: DC | PRN
  Filled 2016-10-12: qty 2

## 2016-10-12 MED ORDER — IBUPROFEN 600 MG PO TABS: 600.0000 mg | ORAL_TABLET | Freq: Four times a day (QID) | ORAL | Status: DC

## 2016-10-12 MED ORDER — ACETAMINOPHEN 325 MG PO TABS: 650.0000 mg | ORAL_TABLET | ORAL | Status: DC | PRN

## 2016-10-12 MED ORDER — TETANUS-DIPHTH-ACELL PERTUSSIS 5-2.5-18.5 LF-MCG/0.5 IM SUSP
0.5000 mL | Freq: Once | INTRAMUSCULAR | Status: AC
  Administered 2016-10-14: 0.5 mL via INTRAMUSCULAR
  Filled 2016-10-12: qty 0.5

## 2016-10-12 MED ORDER — ONDANSETRON HCL 4 MG/2ML IJ SOLN
INTRAMUSCULAR | Status: AC
  Filled 2016-10-12: qty 2

## 2016-10-12 MED ORDER — FENTANYL CITRATE (PF) 100 MCG/2ML IJ SOLN
INTRAMUSCULAR | Status: AC
  Filled 2016-10-12: qty 2

## 2016-10-12 MED ORDER — NALBUPHINE HCL 10 MG/ML IJ SOLN: 5.0000 mg | Freq: Once | INTRAMUSCULAR | Status: AC | PRN

## 2016-10-12 MED ORDER — OXYTOCIN 40 UNITS IN LACTATED RINGERS INFUSION - SIMPLE MED: 2.5000 [IU]/h | INTRAVENOUS | Status: AC

## 2016-10-12 MED ORDER — MORPHINE SULFATE (PF) 0.5 MG/ML IJ SOLN
INTRAMUSCULAR | Status: AC
Start: 2016-10-12 — End: 2016-10-12
  Filled 2016-10-12: qty 10

## 2016-10-12 MED ORDER — LACTATED RINGERS IV SOLN
INTRAVENOUS | Status: DC
  Administered 2016-10-12: 12:00:00 via INTRAVENOUS

## 2016-10-12 MED ORDER — SIMETHICONE 80 MG PO CHEW
80.0000 mg | CHEWABLE_TABLET | ORAL | Status: DC
  Administered 2016-10-12 – 2016-10-13 (×2): 80 mg via ORAL
  Filled 2016-10-12 (×2): qty 1

## 2016-10-12 MED ORDER — SCOPOLAMINE 1 MG/3DAYS TD PT72
1.0000 | MEDICATED_PATCH | Freq: Once | TRANSDERMAL | Status: DC | PRN
  Administered 2016-10-12: 1.5 mg via TRANSDERMAL
  Filled 2016-10-12: qty 1

## 2016-10-12 MED ORDER — PRENATAL MULTIVITAMIN CH
1.0000 | ORAL_TABLET | Freq: Every day | ORAL | Status: DC
  Filled 2016-10-12 (×2): qty 1

## 2016-10-12 MED ORDER — WITCH HAZEL-GLYCERIN EX PADS: 1.0000 "application " | MEDICATED_PAD | CUTANEOUS | Status: DC | PRN

## 2016-10-12 MED ORDER — LACTATED RINGERS IV SOLN
INTRAVENOUS | Status: DC
  Administered 2016-10-12: 16:00:00 via INTRAVENOUS

## 2016-10-12 MED ORDER — DIPHENHYDRAMINE HCL 25 MG PO CAPS: 25.0000 mg | ORAL_CAPSULE | Freq: Four times a day (QID) | ORAL | Status: DC | PRN

## 2016-10-12 MED ORDER — DIBUCAINE 1 % RE OINT: 1.0000 "application " | TOPICAL_OINTMENT | RECTAL | Status: DC | PRN

## 2016-10-12 MED ORDER — CEFAZOLIN SODIUM-DEXTROSE 2-4 GM/100ML-% IV SOLN
INTRAVENOUS | Status: AC
  Filled 2016-10-12: qty 100

## 2016-10-12 MED ORDER — MENTHOL 3 MG MT LOZG: 1.0000 | LOZENGE | OROMUCOSAL | Status: DC | PRN

## 2016-10-12 MED ORDER — ZOLPIDEM TARTRATE 5 MG PO TABS: 5.0000 mg | ORAL_TABLET | Freq: Every evening | ORAL | Status: DC | PRN

## 2016-10-12 MED ORDER — SCOPOLAMINE 1 MG/3DAYS TD PT72: 1.0000 | MEDICATED_PATCH | Freq: Once | TRANSDERMAL | Status: DC

## 2016-10-12 MED ORDER — FAMOTIDINE 20 MG PO TABS: 20.0000 mg | ORAL_TABLET | Freq: Once | ORAL | Status: DC | PRN

## 2016-10-12 MED ORDER — OXYCODONE-ACETAMINOPHEN 5-325 MG PO TABS
2.0000 | ORAL_TABLET | ORAL | Status: DC | PRN
  Administered 2016-10-13: 2 via ORAL
  Filled 2016-10-12 (×3): qty 2

## 2016-10-12 MED ORDER — SENNOSIDES-DOCUSATE SODIUM 8.6-50 MG PO TABS
2.0000 | ORAL_TABLET | ORAL | Status: DC
  Administered 2016-10-14: 2 via ORAL
  Filled 2016-10-12: qty 2

## 2016-10-12 MED ORDER — PANTOPRAZOLE SODIUM 40 MG PO TBEC: 40.0000 mg | DELAYED_RELEASE_TABLET | Freq: Once | ORAL | Status: DC

## 2016-10-12 MED ORDER — SIMETHICONE 80 MG PO CHEW
80.0000 mg | CHEWABLE_TABLET | Freq: Three times a day (TID) | ORAL | Status: DC
  Administered 2016-10-12 – 2016-10-14 (×5): 80 mg via ORAL
  Filled 2016-10-12 (×7): qty 1

## 2016-10-12 MED ORDER — PNEUMOCOCCAL VAC POLYVALENT 25 MCG/0.5ML IJ INJ
0.5000 mL | INJECTION | INTRAMUSCULAR | Status: DC
  Filled 2016-10-12: qty 0.5

## 2016-10-12 MED ORDER — OXYCODONE-ACETAMINOPHEN 5-325 MG PO TABS
1.0000 | ORAL_TABLET | ORAL | Status: DC | PRN
  Administered 2016-10-13 – 2016-10-14 (×2): 1 via ORAL
  Filled 2016-10-12: qty 1

## 2016-10-12 MED ORDER — METOCLOPRAMIDE HCL 10 MG PO TABS: 10.0000 mg | ORAL_TABLET | Freq: Once | ORAL | Status: DC | PRN

## 2016-10-12 MED ORDER — SIMETHICONE 80 MG PO CHEW: 80.0000 mg | CHEWABLE_TABLET | ORAL | Status: DC

## 2016-10-12 MED ORDER — NALBUPHINE HCL 10 MG/ML IJ SOLN
5.0000 mg | Freq: Once | INTRAMUSCULAR | Status: AC | PRN
  Administered 2016-10-12: 5 mg via INTRAVENOUS

## 2016-10-12 MED ORDER — ACETAMINOPHEN 160 MG/5ML PO SOLN
975.0000 mg | Freq: Once | ORAL | Status: AC
  Administered 2016-10-12: 11:00:00 via ORAL
  Filled 2016-10-12: qty 40.6

## 2016-10-12 MED ORDER — OXYCODONE-ACETAMINOPHEN 5-325 MG PO TABS
2.0000 | ORAL_TABLET | ORAL | Status: DC | PRN
  Administered 2016-10-13 – 2016-10-14 (×2): 2 via ORAL

## 2016-10-12 MED ORDER — PHENYLEPHRINE 8 MG IN D5W 100 ML (0.08MG/ML) PREMIX OPTIME
INJECTION | INTRAVENOUS | Status: DC | PRN
  Administered 2016-10-12: 60 ug/min via INTRAVENOUS

## 2016-10-12 MED ORDER — OXYTOCIN 10 UNIT/ML IJ SOLN
INTRAVENOUS | Status: DC | PRN
  Administered 2016-10-12: 40 [IU] via INTRAVENOUS

## 2016-10-12 MED ORDER — SIMETHICONE 80 MG PO CHEW
80.0000 mg | CHEWABLE_TABLET | ORAL | Status: DC | PRN
  Administered 2016-10-14: 80 mg via ORAL

## 2016-10-12 MED ORDER — NALBUPHINE HCL 10 MG/ML IJ SOLN
INTRAMUSCULAR | Status: AC
  Administered 2016-10-13: 5 mg via SUBCUTANEOUS
  Filled 2016-10-12: qty 1

## 2016-10-12 MED ORDER — PHENYLEPHRINE 8 MG IN D5W 100 ML (0.08MG/ML) PREMIX OPTIME
INJECTION | INTRAVENOUS | Status: AC
  Filled 2016-10-12: qty 100

## 2016-10-12 MED ORDER — NALOXONE HCL 0.4 MG/ML IJ SOLN: 0.4000 mg | INTRAMUSCULAR | Status: DC | PRN

## 2016-10-12 MED ORDER — PRENATAL MULTIVITAMIN CH
1.0000 | ORAL_TABLET | Freq: Every day | ORAL | Status: DC
  Administered 2016-10-13 – 2016-10-14 (×2): 1 via ORAL
  Filled 2016-10-12: qty 1

## 2016-10-12 MED ORDER — DIPHENHYDRAMINE HCL 50 MG/ML IJ SOLN: 12.5000 mg | INTRAMUSCULAR | Status: DC | PRN

## 2016-10-12 MED ORDER — ONDANSETRON HCL 4 MG/2ML IJ SOLN
INTRAMUSCULAR | Status: DC | PRN
  Administered 2016-10-12: 4 mg via INTRAVENOUS

## 2016-10-12 MED ORDER — SIMETHICONE 80 MG PO CHEW: 80.0000 mg | CHEWABLE_TABLET | Freq: Three times a day (TID) | ORAL | Status: DC

## 2016-10-12 MED ORDER — LACTATED RINGERS IV SOLN
INTRAVENOUS | Status: DC
  Administered 2016-10-12 (×3): via INTRAVENOUS

## 2016-10-12 MED ORDER — FENTANYL CITRATE (PF) 100 MCG/2ML IJ SOLN: 25.0000 ug | INTRAMUSCULAR | Status: DC | PRN

## 2016-10-12 MED ORDER — COCONUT OIL OIL: 1.0000 "application " | TOPICAL_OIL | Status: DC | PRN

## 2016-10-12 MED ORDER — IBUPROFEN 600 MG PO TABS
600.0000 mg | ORAL_TABLET | Freq: Four times a day (QID) | ORAL | Status: DC
  Administered 2016-10-12 – 2016-10-14 (×7): 600 mg via ORAL
  Filled 2016-10-12 (×8): qty 1

## 2016-10-12 MED ORDER — MORPHINE SULFATE (PF) 0.5 MG/ML IJ SOLN
INTRAMUSCULAR | Status: DC | PRN
  Administered 2016-10-12: .1 mg via EPIDURAL

## 2016-10-12 MED ORDER — SODIUM CHLORIDE 0.9 % IR SOLN
Status: DC | PRN
  Administered 2016-10-12: 1000 mL

## 2016-10-12 MED ORDER — OXYTOCIN 40 UNITS IN LACTATED RINGERS INFUSION - SIMPLE MED: 2.5000 [IU]/h | INTRAVENOUS | Status: DC

## 2016-10-12 MED ORDER — NALBUPHINE HCL 10 MG/ML IJ SOLN
5.0000 mg | INTRAMUSCULAR | Status: DC | PRN
  Administered 2016-10-12 – 2016-10-13 (×2): 5 mg via SUBCUTANEOUS
  Filled 2016-10-12 (×2): qty 1

## 2016-10-12 MED ORDER — NALBUPHINE HCL 10 MG/ML IJ SOLN: 5.0000 mg | INTRAMUSCULAR | Status: DC | PRN

## 2016-10-12 MED ORDER — ONDANSETRON HCL 4 MG/2ML IJ SOLN: 4.0000 mg | Freq: Three times a day (TID) | INTRAMUSCULAR | Status: DC | PRN

## 2016-10-12 MED ORDER — SODIUM CHLORIDE 0.9% FLUSH: 3.0000 mL | INTRAVENOUS | Status: DC | PRN

## 2016-10-12 MED ORDER — SENNOSIDES-DOCUSATE SODIUM 8.6-50 MG PO TABS
2.0000 | ORAL_TABLET | ORAL | Status: DC
  Administered 2016-10-12 – 2016-10-13 (×2): 2 via ORAL
  Filled 2016-10-12 (×2): qty 2

## 2016-10-12 MED ORDER — CEFAZOLIN SODIUM-DEXTROSE 2-4 GM/100ML-% IV SOLN
2.0000 g | INTRAVENOUS | Status: AC
  Administered 2016-10-12: 2 g via INTRAVENOUS

## 2016-10-12 MED ORDER — OXYTOCIN 10 UNIT/ML IJ SOLN
INTRAMUSCULAR | Status: AC
  Filled 2016-10-12: qty 4

## 2016-10-12 MED ORDER — FENTANYL CITRATE (PF) 100 MCG/2ML IJ SOLN
INTRAMUSCULAR | Status: DC | PRN
  Administered 2016-10-12: 25 ug via INTRAVENOUS

## 2016-10-12 MED ORDER — SIMETHICONE 80 MG PO CHEW: 80.0000 mg | CHEWABLE_TABLET | ORAL | Status: DC | PRN

## 2016-10-12 MED ORDER — OXYCODONE-ACETAMINOPHEN 5-325 MG PO TABS
1.0000 | ORAL_TABLET | ORAL | Status: DC | PRN
  Filled 2016-10-12: qty 1

## 2016-10-12 MED ORDER — DIPHENHYDRAMINE HCL 25 MG PO CAPS
25.0000 mg | ORAL_CAPSULE | ORAL | Status: DC | PRN
  Administered 2016-10-12: 25 mg via ORAL
  Filled 2016-10-12 (×2): qty 1

## 2016-10-12 MED ORDER — MEPERIDINE HCL 25 MG/ML IJ SOLN: 6.2500 mg | INTRAMUSCULAR | Status: DC | PRN

## 2016-10-12 MED ORDER — LACTATED RINGERS IV SOLN
INTRAVENOUS | Status: DC
  Administered 2016-10-12: 17:00:00 via INTRAVENOUS

## 2016-10-12 MED ORDER — BUPIVACAINE IN DEXTROSE 0.75-8.25 % IT SOLN
INTRATHECAL | Status: AC
  Filled 2016-10-12: qty 2

## 2016-10-12 MED ORDER — TETANUS-DIPHTH-ACELL PERTUSSIS 5-2.5-18.5 LF-MCG/0.5 IM SUSP: 0.5000 mL | Freq: Once | INTRAMUSCULAR | Status: DC

## 2016-10-12 MED ORDER — ACETAMINOPHEN 500 MG PO TABS
1000.0000 mg | ORAL_TABLET | Freq: Four times a day (QID) | ORAL | Status: AC
  Administered 2016-10-12: 1000 mg via ORAL
  Filled 2016-10-12 (×3): qty 2

## 2016-10-12 SURGICAL SUPPLY — 28 items
BENZOIN TINCTURE PRP APPL 2/3 (GAUZE/BANDAGES/DRESSINGS) ×3 IMPLANT
CHLORAPREP W/TINT 26ML (MISCELLANEOUS) ×3 IMPLANT
CLOSURE STERI STRIP 1/2 X4 (GAUZE/BANDAGES/DRESSINGS) ×3 IMPLANT
CLOTH BEACON ORANGE TIMEOUT ST (SAFETY) ×3 IMPLANT
DRSG OPSITE POSTOP 4X10 (GAUZE/BANDAGES/DRESSINGS) ×3 IMPLANT
ELECT REM PT RETURN 9FT ADLT (ELECTROSURGICAL) ×3
ELECTRODE REM PT RTRN 9FT ADLT (ELECTROSURGICAL) ×1 IMPLANT
GLOVE BIOGEL PI IND STRL 7.0 (GLOVE) ×3 IMPLANT
GLOVE BIOGEL PI INDICATOR 7.0 (GLOVE) ×6
GLOVE ECLIPSE 6.5 STRL STRAW (GLOVE) ×3 IMPLANT
GOWN STRL REUS W/ TWL LRG LVL3 (GOWN DISPOSABLE) ×2 IMPLANT
GOWN STRL REUS W/TWL LRG LVL3 (GOWN DISPOSABLE) ×4
NEEDLE HYPO 22GX1.5 SAFETY (NEEDLE) ×3 IMPLANT
NS IRRIG 1000ML POUR BTL (IV SOLUTION) ×3 IMPLANT
PAD OB MATERNITY 4.3X12.25 (PERSONAL CARE ITEMS) ×3 IMPLANT
PAD PREP 24X48 CUFFED NSTRL (MISCELLANEOUS) ×3 IMPLANT
PENCIL BUTTON HOLSTER BLD 10FT (ELECTRODE) ×3 IMPLANT
RETRACTOR WND ALEXIS 25 LRG (MISCELLANEOUS) ×1 IMPLANT
RTRCTR WOUND ALEXIS 25CM LRG (MISCELLANEOUS) ×3
STRIP CLOSURE SKIN 1/2X4 (GAUZE/BANDAGES/DRESSINGS) ×2 IMPLANT
SUT PLAIN 2 0 XLH (SUTURE) ×3 IMPLANT
SUT VIC AB 0 CT1 36 (SUTURE) ×6 IMPLANT
SUT VIC AB 2-0 CT1 27 (SUTURE) ×2
SUT VIC AB 2-0 CT1 TAPERPNT 27 (SUTURE) ×1 IMPLANT
SUT VIC AB 4-0 KS 27 (SUTURE) ×3 IMPLANT
SYR CONTROL 10ML LL (SYRINGE) ×3 IMPLANT
TOWEL OR 17X24 6PK STRL BLUE (TOWEL DISPOSABLE) ×9 IMPLANT
TRAY FOLEY CATH SILVER 16FR (SET/KITS/TRAYS/PACK) ×3 IMPLANT

## 2016-10-12 NOTE — Transfer of Care (Signed)
Immediate Anesthesia Transfer of Care Note  Patient: Cathy Elliott  Procedure(s) Performed: Procedure(s): CESAREAN SECTION (N/A)  Patient Location: PACU  Anesthesia Type:Spinal  Level of Consciousness: sedated  Airway & Oxygen Therapy: Patient Spontanous Breathing  Post-op Assessment: Report given to RN and Post -op Vital signs reviewed and stable  Post vital signs: Reviewed and stable  Last Vitals:  Vitals:   10/12/16 0854 10/12/16 1048  BP:  114/61  Pulse:  76  Temp: 36.8 C     Last Pain:  Vitals:   10/12/16 0854  TempSrc: Oral         Complications: No apparent anesthesia complications

## 2016-10-12 NOTE — Lactation Note (Signed)
This note was copied from a baby's chart. Lactation Consultation Note  P3 mom had BF after delivery.  When LC entered room pt.'s mom stated she only BF because infant wanted to eat after delivery and there wasn't a bottle but that pt. Wants to bottle feed.  Pt. Holding infant STS nods her head agreeing.  LC encouraged mom to call out if she decides to bf again and has concerns or questions.  LC provided family with brochure LC info incase pt. Changes her mind and decides she wants to BF infant.  Family very receptive when explaining services and support groups available if deciding to breastfeed infant.  Brochure left with patient.    Patient Name: Cathy Hubbard RobinsonKeydra Cardwell-Davis WUJWJ'XToday's Date: 10/12/2016     Maternal Data    Feeding Feeding Type: Bottle Fed - Formula Nipple Type: Slow - flow Length of feed: 10 min  LATCH Score/Interventions Latch: Grasps breast easily, tongue down, lips flanged, rhythmical sucking.  Audible Swallowing: Spontaneous and intermittent  Type of Nipple: Everted at rest and after stimulation  Comfort (Breast/Nipple): Soft / non-tender     Hold (Positioning): Assistance needed to correctly position infant at breast and maintain latch.  LATCH Score: 9  Lactation Tools Discussed/Used     Consult Status      Maryruth HancockKelly [facility] 10/12/2016, 4:02 PM
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
## 2016-10-12 NOTE — Op Note (Signed)
[name] PROCEDURE DATE: 10/12/2016  PREOPERATIVE DIAGNOSES: Intrauterine pregnancy at 8264w4d weeks gestation; Elective Repeat C-section  POSTOPERATIVE DIAGNOSES: The same  PROCEDURE: Repeat Low Transverse Cesarean Section  SURGEON:  Dr. Lyndel SafeKimberly Mikayela Deats  ASSISTANT:  Mamie Leversracy Tucker RN, Tinnie Gensanya Pratt MD  INDICATIONS: Cathy HindersKeydra M Elliott is a 26 y.o. [phone] at 6064w4d here for cesarean section secondary to the indications listed under preoperative diagnoses; please see preoperative note for further details.  The risks of cesarean section were discussed with the patient including but were not limited to: bleeding which may require transfusion or reoperation; infection which may require antibiotics; injury to bowel, bladder, ureters or other surrounding organs; injury to the fetus; need for additional procedures including hysterectomy in the event of a life-threatening hemorrhage; placental abnormalities wth subsequent pregnancies, incisional problems, thromboembolic phenomenon and other postoperative/anesthesia complications.   The patient concurred with the proposed plan, giving informed written consent for the procedure.    FINDINGS:  Viable female infant in cephalic presentation.  Apgars 9 and 10.  Clear amniotic fluid.  Intact placenta, three vessel cord.  Normal uterus, fallopian tubes and ovaries bilaterally. Thickened perituneum, minimal scar tissue, paper thin lower uterine segment.   ANESTHESIA: Spinal INTRAVENOUS FLUIDS: 2200 ml ESTIMATED BLOOD LOSS: 600 ml URINE OUTPUT:  100 ml SPECIMENS: L&D COMPLICATIONS: None immediate  PROCEDURE IN DETAIL:  The patient preoperatively received intravenous antibiotics and had sequential compression devices applied to her lower extremities.  She was then taken to the operating room where spinal anesthesia was administered and was found to be adequate. She was then placed in a dorsal supine position with a leftward tilt, and prepped and draped  in a sterile manner.  A foley catheter was placed into her bladder and attached to constant gravity.  After an adequate timeout was performed, a Pfannenstiel skin incision was made with scalpel and carried through to the underlying layer of fascia. The fascia was incised in the midline, and this incision was extended bilaterally using the Mayo scissors.  Kocher clamps were applied to the superior aspect of the fascial incision and the underlying rectus muscles were dissected off bluntly. A similar process was carried out on the inferior aspect of the fascial incision. The rectus muscles were separated in the midline bluntly and the peritoneum was entered bluntly. Attention was turned to the lower uterine segment where a low transverse hysterotomy was made with a scalpel and extended bilaterally bluntly.  The infant was successfully delivered, the cord was clamped and cut and the infant was handed over to awaiting neonatology team. Uterine massage was then administered, and the placenta delivered intact with a three-vessel cord. The uterus was then cleared of clot and debris.  The hysterotomy was closed with 0 Vicryl in a running locked fashion, and an imbricating layer was also placed with 0 Vicryl.Figure-of-eight serosal stitches were placed to help with hemostasis.  The pelvis was cleared of all clot and debris. Hemostasis was confirmed on all surfaces.  The peritoneum and the muscles were reapproximated using 0 Vicryl interrupted stitches. The fascia was then closed using 0 Vicryl.  The skin was closed with a 4-0 Vicryl subcuticular stitch. The patient tolerated the procedure well. Sponge, lap, instrument and needle counts were correct x 2.  She was taken to the recovery room in stable condition.   Cathy FlakeKimberly Elliott Cathy Patalano, MD, MPH, ABFM Attending Physician Faculty Practice- [facility]

## 2016-10-12 NOTE — Anesthesia Postprocedure Evaluation (Signed)
Anesthesia Post Note  Patient: Cathy Elliott  Procedure(s) Performed: Procedure(s) (LRB): CESAREAN SECTION (N/A)  Patient location during evaluation: PACU Anesthesia Type: Spinal Level of consciousness: awake Pain management: satisfactory to patient Vital Signs Assessment: post-procedure vital signs reviewed and stable Respiratory status: spontaneous breathing Cardiovascular status: blood pressure returned to baseline Postop Assessment: no headache and spinal receding Anesthetic complications: no        Last Vitals:  Vitals:   10/12/16 1500 10/12/16 1600  BP: 122/61 121/70  Pulse: (!) 58 66  Resp: 18 18  Temp: 36.5 C 36.6 C    Last Pain:  Vitals:   10/12/16 1600  TempSrc: Oral   Pain Goal:                 Jiles GarterJACKSON,Delois Tolbert EDWARD

## 2016-10-12 NOTE — Anesthesia Preprocedure Evaluation (Signed)
Anesthesia Evaluation  Patient identified by MRN, date of birth, ID band Patient awake    Reviewed: Allergy & Precautions, H&P , Patient's Chart, lab work & pertinent test results  Airway Mallampati: II  TM Distance: >3 FB Neck ROM: full    Dental no notable dental hx.    Pulmonary former smoker,    Pulmonary exam normal breath sounds clear to auscultation       Cardiovascular Exercise Tolerance: Good  Rhythm:regular Rate:Normal     Neuro/Psych    GI/Hepatic   Endo/Other    Renal/GU      Musculoskeletal   Abdominal   Peds  Hematology  (+) anemia ,   Anesthesia Other Findings   Reproductive/Obstetrics                             Anesthesia Physical Anesthesia Plan  ASA: II  Anesthesia Plan: Spinal   Post-op Pain Management:    Induction:   Airway Management Planned:   Additional Equipment:   Intra-op Plan:   Post-operative Plan:   Informed Consent: I have reviewed the patients History and Physical, chart, labs and discussed the procedure including the risks, benefits and alternatives for the proposed anesthesia with the patient or authorized representative who has indicated his/her understanding and acceptance.     Plan Discussed with:   Anesthesia Plan Comments: (  )        Anesthesia Quick Evaluation

## 2016-10-12 NOTE — Anesthesia Procedure Notes (Signed)
Spinal  Patient location during procedure: OR Preanesthetic Checklist Completed: patient identified, site marked, surgical consent, pre-op evaluation, timeout performed, IV checked, risks and benefits discussed and monitors and equipment checked Spinal Block Patient position: sitting Prep: DuraPrep Patient monitoring: heart rate, cardiac monitor, continuous pulse ox and blood pressure Approach: midline Location: L3-4 Injection technique: single-shot Needle Needle type: Sprotte  Needle gauge: 24 G Needle length: 9 cm Assessment Sensory level: T4 Additional Notes Spinal Dosage in OR  .75% Bupivicaine ml       1.     [id number]   mcg        100    Fentanyl mcg            25

## 2016-10-13 LAB — BIRTH TISSUE RECOVERY COLLECTION (PLACENTA DONATION)

## 2016-10-13 LAB — CBC
HEMATOCRIT: 24 % — AB (ref 36.0–46.0)
Hemoglobin: 7.8 g/dL — ABNORMAL LOW (ref 12.0–15.0)
MCH: 22 pg — ABNORMAL LOW (ref 26.0–34.0)
MCHC: 32.5 g/dL (ref 30.0–36.0)
MCV: 67.6 fL — ABNORMAL LOW (ref 78.0–100.0)
Platelets: 143 10*3/uL — ABNORMAL LOW (ref 150–400)
RBC: 3.55 MIL/uL — ABNORMAL LOW (ref 3.87–5.11)
RDW: 16.9 % — AB (ref 11.5–15.5)
WBC: 8.7 10*3/uL (ref 4.0–10.5)

## 2016-10-13 MED ORDER — POLYSACCHARIDE IRON COMPLEX 150 MG PO CAPS
150.0000 mg | ORAL_CAPSULE | Freq: Two times a day (BID) | ORAL | Status: DC
  Administered 2016-10-13 – 2016-10-14 (×3): 150 mg via ORAL
  Filled 2016-10-13 (×3): qty 1

## 2016-10-13 NOTE — Progress Notes (Signed)
Post Partum Day 1/Post op day 1  Subjective: no complaints, up ad lib and tolerating PO  Objective: Blood pressure (!) 109/51, pulse 62, temperature 98 F (36.7 C), resp. rate 18, height 5\' 2"  (1.575 m), weight 175 lb (79.4 kg), last menstrual period 01/05/2016, SpO2 97 %, patient bottle feeding  Physical Exam:  General: alert, cooperative and no distress Lochia: appropriate Uterine Fundus: firm Incision: honeycomb dressing noted, no excessive draining noted,small bloody draining noted,dressing change ordered,abd binder ordered DVT Evaluation: No evidence of DVT seen on physical exam.   Recent Labs  10/12/16 1541 10/13/16 0532  HGB 9.4* 7.8*  HCT 29.6* 24.0*    Assessment/Plan: Patient reports doing well, bottle feeding last hgb 7.8 repeat,iron started, asymptomatic cbc ordered in am.   LOS: 1 day   Elzina Devera,SNM 10/13/2016, 8:10 AM

## 2016-10-14 LAB — CBC
HEMATOCRIT: 23.8 % — AB (ref 36.0–46.0)
HEMOGLOBIN: 7.6 g/dL — AB (ref 12.0–15.0)
MCH: 21.6 pg — AB (ref 26.0–34.0)
MCHC: 31.9 g/dL (ref 30.0–36.0)
MCV: 67.6 fL — AB (ref 78.0–100.0)
Platelets: 159 10*3/uL (ref 150–400)
RBC: 3.52 MIL/uL — ABNORMAL LOW (ref 3.87–5.11)
RDW: 17.1 % — AB (ref 11.5–15.5)
WBC: 9.4 10*3/uL (ref 4.0–10.5)

## 2016-10-14 MED ORDER — IBUPROFEN 600 MG PO TABS: 600.0000 mg | ORAL_TABLET | Freq: Four times a day (QID) | ORAL | 0 refills | Status: DC

## 2016-10-14 MED ORDER — POLYSACCHARIDE IRON COMPLEX 150 MG PO CAPS: 150.0000 mg | ORAL_CAPSULE | Freq: Two times a day (BID) | ORAL | 1 refills | Status: DC

## 2016-10-14 MED ORDER — OXYCODONE-ACETAMINOPHEN 5-325 MG PO TABS: 1.0000 | ORAL_TABLET | ORAL | 0 refills | Status: DC | PRN

## 2016-10-14 MED ORDER — DOCUSATE SODIUM 100 MG PO CAPS: 100.0000 mg | ORAL_CAPSULE | Freq: Two times a day (BID) | ORAL | 0 refills | Status: DC
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
## 2016-10-14 NOTE — Discharge Summary (Signed)
OB Discharge Summary     Patient Name: Cathy HindersKeydra M Cardwell-Davis DOB: 10/20/1990 MRN: [id number]  Date of admission: 10/12/2016 Delivering MD: Lyndel SafeNEWTON, KIMBERLY NILES   Date of discharge: 10/14/2016  Admitting diagnosis: RCS Intrauterine pregnancy: 764w4d     Secondary diagnosis:  Active Problems:   Smoker   H/O cesarean section   Anemia, iron deficiency   Supervision of other normal pregnancy, antepartum   S/P repeat low transverse C-section  Additional problems: None     Discharge diagnosis: Term Pregnancy Delivered                                                                                                Post partum procedures:None  Augmentation: None  Complications: None  Hospital course:  Sceduled C/S   26 y.o. yo [id number] at 734w4d was admitted to the hospital 10/12/2016 for scheduled cesarean section with the following indication:Elective Repeat.  Membrane Rupture Time/Date: 11:38 AM ,10/12/2016   Patient delivered a Viable infant.10/12/2016  Details of operation can be found in separate operative note.  Pateint had an uncomplicated postpartum course.  She is ambulating, tolerating a regular diet, passing flatus, and urinating well. Patient is discharged home in stable condition on  10/14/16         Physical exam  Vitals:   10/13/16 0656 10/13/16 1155 10/13/16 1745 10/14/16 0543  BP: (!) 109/51 (!) 118/59 108/61 (!) 117/45  Pulse: 62 72 63 67  Resp: 18 18 18 18   Temp: 98 F (36.7 C) 98 F (36.7 C) 98.2 F (36.8 C) 97.8 F (36.6 C)  TempSrc:  Oral Oral Oral  SpO2:  100%    Weight:      Height:       General: alert, cooperative and no distress Lochia: appropriate Uterine Fundus: firm Incision: Dressing is clean, dry, and intact DVT Evaluation: No evidence of DVT seen on physical exam. Labs: Lab Results  Component Value Date   WBC 9.4 10/14/2016   HGB 7.6 (L) 10/14/2016   HCT 23.8 (L) 10/14/2016   MCV 67.6 (L) 10/14/2016   PLT 159 10/14/2016   CMP Latest  Ref Rng & Units 08/19/2016  Glucose 65 - 99 mg/dL 91  BUN 6 - 20 mg/dL 8  Creatinine 1.470.44 - 8.291.00 mg/dL 5.620.51  Sodium 130135 - 865145 mmol/L 136  Potassium 3.5 - 5.1 mmol/L 3.5  Chloride 101 - 111 mmol/L 105  CO2 22 - 32 mmol/L 23  Calcium 8.9 - 10.3 mg/dL 8.9  Total Protein 6.0 - 8.3 g/dL -  Total Bilirubin 0.3 - 1.2 mg/dL -  Alkaline Phos 39 - 784117 U/L -  AST 0 - 37 U/L -  ALT 0 - 35 U/L -    Discharge instruction: per After Visit Summary and "Baby and Me Booklet".  After visit meds:  Allergies as of 10/14/2016   No Known Allergies     Medication List    STOP taking these medications   calcium carbonate 500 MG chewable tablet Commonly known as:  TUMS - dosed in mg elemental calcium   PRENATAL GUMMIES/DHA & FA 0.4-32.5  MG Chew     TAKE these medications   docusate sodium 100 MG capsule Commonly known as:  COLACE Take 1 capsule (100 mg total) by mouth 2 (two) times daily.   ibuprofen 600 MG tablet Commonly known as:  ADVIL,MOTRIN Take 1 tablet (600 mg total) by mouth every 6 (six) hours.   iron polysaccharides 150 MG capsule Commonly known as:  NIFEREX Take 1 capsule (150 mg total) by mouth 2 (two) times daily.   omeprazole 20 MG capsule Commonly known as:  PRILOSEC Take 1 capsule (20 mg total) by mouth 2 (two) times daily before a meal. What changed:  when to take this  reasons to take this   oxyCODONE-acetaminophen 5-325 MG tablet Commonly known as:  PERCOCET/ROXICET Take 1 tablet by mouth every 4 (four) hours as needed (pain scale 4-7).       Diet: routine diet  Activity: Advance as tolerated. Pelvic rest for 6 weeks.   Outpatient follow up:6 weeks Follow up Appt: Future Appointments Date Time Provider Department Center  11/16/2016 10:45 AM Roe Coombs, CNM [facility] None   Follow up Visit:No Follow-up on file.  Postpartum contraception: Nexplanon  Newborn Data: Live born female  Birth Weight: 7 lb 7.2 oz (3380 g) APGAR: 9, 10  Baby Feeding:  Bottle Disposition:home with mother   10/14/2016 Lovena Neighbours, MD  I have seen and examined this patient and agree the above assessment.  Respiratory effort normal, lochia appropriate, legs negative,  pain level normal.  CRESENZO-DISHMAN,Reveca Desmarais 10/15/2016 11:38 AM

## 2016-10-14 NOTE — Discharge Instructions (Signed)

## 2016-10-20 ENCOUNTER — Telehealth: Payer: Self-pay | Admitting: *Deleted

## 2016-10-20 NOTE — Telephone Encounter (Signed)
Incoming call from Home nurse. Pt had well at home visit.  Pt makes nurse aware she is having classic UTI symptoms. Nurse made pt aware she will contact office to verify if appt needed. Nurse made aware treatment may be sent per provider. Will contact pt to make her aware if treatment sent or if needs appt.    Please advise on tx.

## 2016-10-21 ENCOUNTER — Other Ambulatory Visit: Payer: Self-pay | Admitting: Certified Nurse Midwife

## 2016-10-21 DIAGNOSIS — R399 Unspecified symptoms and signs involving the genitourinary system: Secondary | ICD-10-CM

## 2016-10-21 MED ORDER — PHENAZOPYRIDINE HCL 200 MG PO TABS: 200.0000 mg | ORAL_TABLET | Freq: Three times a day (TID) | ORAL | 0 refills | Status: DC | PRN

## 2016-10-21 MED ORDER — NITROFURANTOIN MONOHYD MACRO 100 MG PO CAPS: 100.0000 mg | ORAL_CAPSULE | Freq: Two times a day (BID) | ORAL | 0 refills | Status: DC

## 2016-10-21 NOTE — Telephone Encounter (Signed)
LM on VM for pt to return call. 

## 2016-10-21 NOTE — Telephone Encounter (Signed)
Please let her know that pyridium and Macrobid were sent to the pharmacy for her to take.  Thank you.  R.Lum Stillinger CNM

## 2016-11-16 ENCOUNTER — Ambulatory Visit (INDEPENDENT_AMBULATORY_CARE_PROVIDER_SITE_OTHER): Payer: Medicaid Other | Admitting: Certified Nurse Midwife

## 2016-11-16 ENCOUNTER — Encounter: Payer: Self-pay | Admitting: *Deleted

## 2016-11-16 ENCOUNTER — Encounter: Payer: Self-pay | Admitting: Certified Nurse Midwife

## 2016-11-16 VITALS — BP 114/72 | HR 92 | Wt 153.9 lb

## 2016-11-16 DIAGNOSIS — Z3202 Encounter for pregnancy test, result negative: Secondary | ICD-10-CM

## 2016-11-16 DIAGNOSIS — Z3049 Encounter for surveillance of other contraceptives: Secondary | ICD-10-CM

## 2016-11-16 DIAGNOSIS — Z30017 Encounter for initial prescription of implantable subdermal contraceptive: Secondary | ICD-10-CM | POA: Insufficient documentation

## 2016-11-16 LAB — POCT URINE PREGNANCY: Preg Test, Ur: NEGATIVE

## 2016-11-16 MED ORDER — ETONOGESTREL 68 MG ~~LOC~~ IMPL: 68.0000 mg | DRUG_IMPLANT | Freq: Once | SUBCUTANEOUS | Status: AC

## 2016-11-16 NOTE — Progress Notes (Signed)
Nexplanon Procedure Note   PRE-OP DIAGNOSIS: desired long-term, reversible contraception  POST-OP DIAGNOSIS: Same  PROCEDURE: Nexplanon  placement Performing Provider: Orvilla Cornwallachelle Mandy Peeks CNM   Patient education prior to procedure, explained risk, benefits of Nexplanon, reviewed alternative options. Patient reported understanding. Gave consent to continue with procedure.   PROCEDURE:  Pregnancy Text :  Negative Site (check):      left arm         Sterile Preparation:   Betadinex3 Lot # [id number] Expiration Date 04/2019  Insertion site was selected 8 - 10 cm from medial epicondyle and marked along with guiding site using sterile marker. Procedure area was prepped and draped in a sterile fashion. 1% Lidocaine 1.5 ml given prior to procedure. Nexplanon  was inserted subcutaneously.Needle was removed from the insertion site. Nexplanon capsule was palpated by provider and patient to assure satisfactory placement. And a bandage applied and the arm was wrapped with gauze bandage.     Followup: The patient tolerated the procedure well without complications.  Instructions:  The patient was instructed to remove the dressing in 24 hours and that some bruising is to be expected.  She was advised to use over the counter analgesics as needed for any pain at the site.  She is to keep the area dry for 24 hours and to call if her hand or arm becomes cold, numb, or blue.   Orvilla Cornwallachelle Cyana Shook CNM

## 2016-11-16 NOTE — Progress Notes (Signed)
Subjective:     Cathy Elliott is a 26 y.o. female who presents for a postpartum visit. She is 5 weeks postpartum following a low cervical transverse Cesarean section. I have fully reviewed the prenatal and intrapartum course. The delivery was at 14 gestational weeks. Outcome: repeat cesarean section, low transverse incision. Anesthesia: epidural. Postpartum course has been UNREMARKABLE. Baby's course has been UNREMARKABLE. Baby is feeding by bottle - Similac Advance. Bleeding no bleeding. Bowel function is normal. Bladder function is normal. Patient is not sexually active. Contraception method is Nexplanon. Postpartum depression screening: negative.  The following portions of the patient's history were reviewed and updated as appropriate: allergies, current medications, past family history, past medical history, past social history, past surgical history and problem list.  Review of Systems Pertinent items noted in HPI and remainder of comprehensive ROS otherwise negative.   Objective:    BP 114/72   Pulse 92   Wt 153 lb 14.4 oz (69.8 kg)   LMP  (LMP Unknown)   Breastfeeding? No   BMI 28.15 kg/m   General:  alert, cooperative and no distress   Breasts:  inspection negative, no nipple discharge or bleeding, no masses or nodularity palpable  Lungs: clear to auscultation bilaterally  Heart:  regular rate and rhythm, S1, S2 normal, no murmur, click, rub or gallop  Abdomen: soft, non-tender; bowel sounds normal; no masses,  no organomegaly  Pelvic Exam: Not performed.        Assessment:     Normal 5 week postpartum exam. Pap smear not done at today's visit.   Plan:    1. Contraception: abstinence, Nexplanon and Nexplanon placed today 2.  Last pap smear normal.  Due for annual exam 07/28/17. SW met with patient in office.  3. Follow up in: 8 months or as needed.

## 2016-12-09 ENCOUNTER — Ambulatory Visit: Payer: Medicaid Other | Admitting: Obstetrics

## 2016-12-16 NOTE — Anesthesia Postprocedure Evaluation (Signed)
Anesthesia Post Note  Patient: Cathy Elliott  Procedure(s) Performed: Procedure(s) (LRB): CESAREAN SECTION (N/A)     Anesthesia Post Evaluation  Last Vitals:  Vitals:   10/13/16 1745 10/14/16 0543  BP: 108/61 (!) 117/45  Pulse: 63 67  Resp: 18 18  Temp: 36.8 C 36.6 C    Last Pain:  Vitals:   10/14/16 1231  TempSrc:   PainSc: 7                  Lugene Beougher EDWARD

## 2016-12-16 NOTE — Addendum Note (Signed)
Addendum  created 12/16/16 1240 by Ezra Denne, MD   Sign clinical note    

## 2017-01-18 IMAGING — US US MFM OB TRANSVAGINAL
1 series · 15 of 28 positions shown · non-contrast
Comparison: none

[Series 1: us mfm ob transvaginal · 29 acquisitions, 15 frames shown]
[im 1/29]
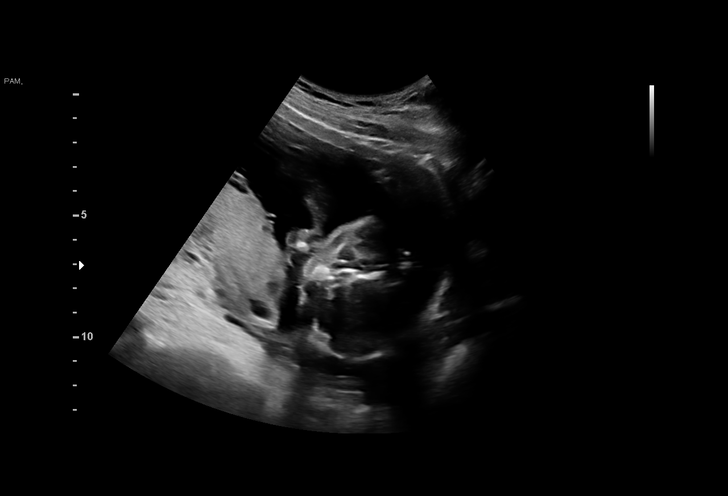
[im 3/29]
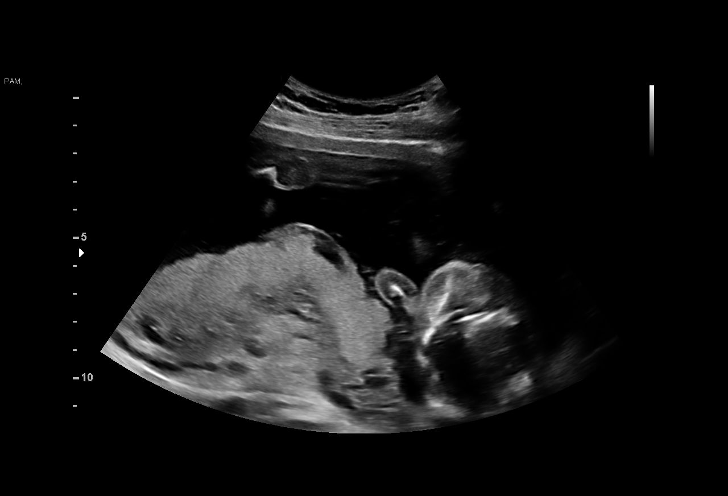
[im 5/29]
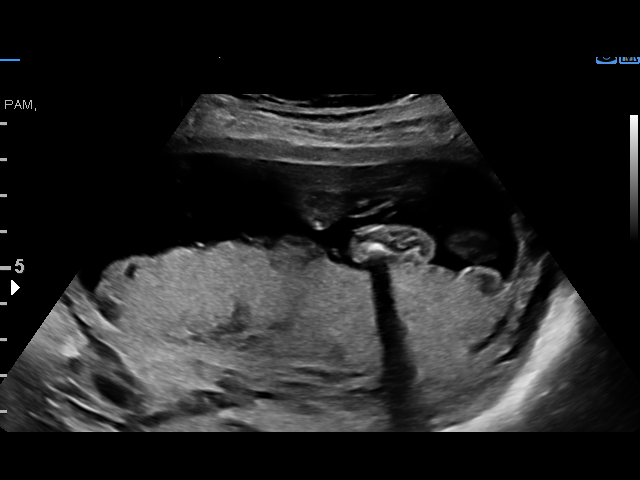
[im 7/29]
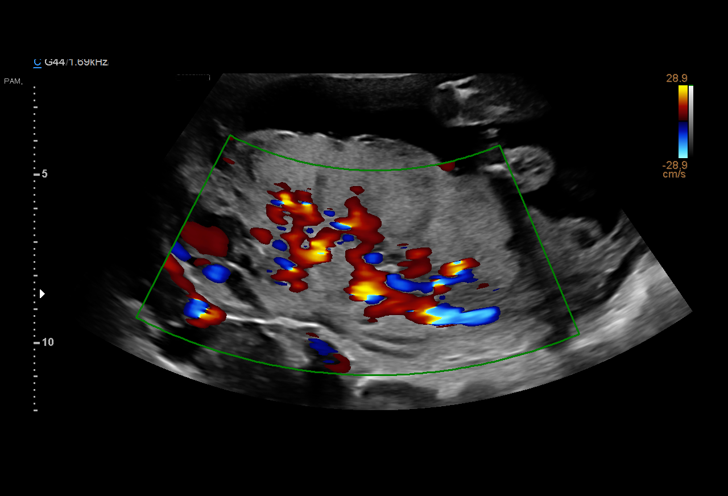
[im 9/29]
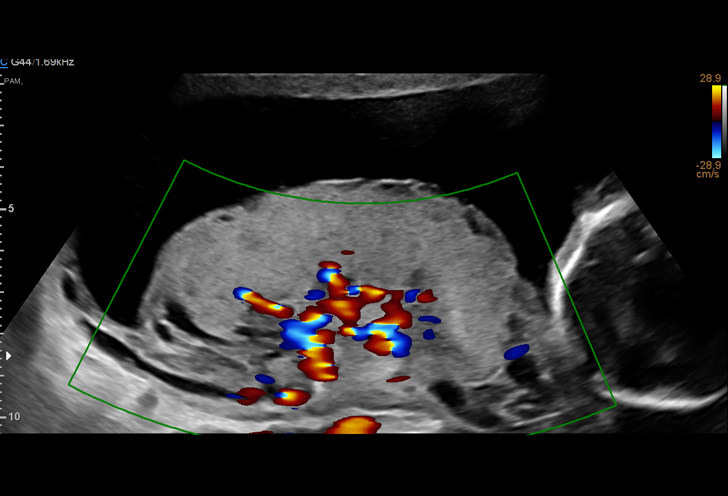
[im 11/29]
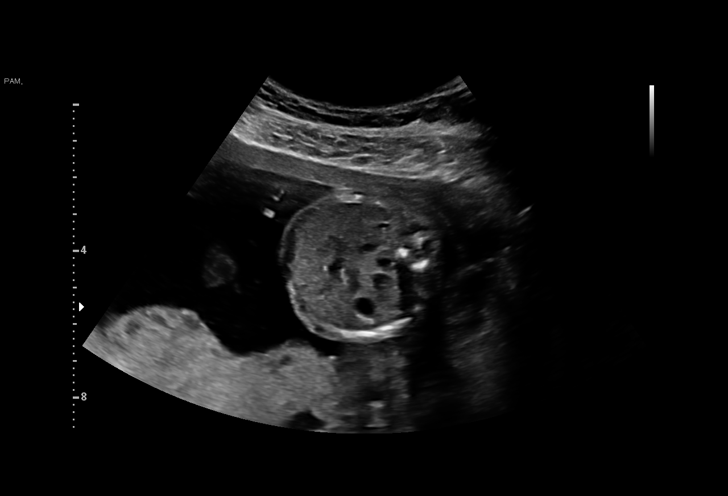
[im 13/29]
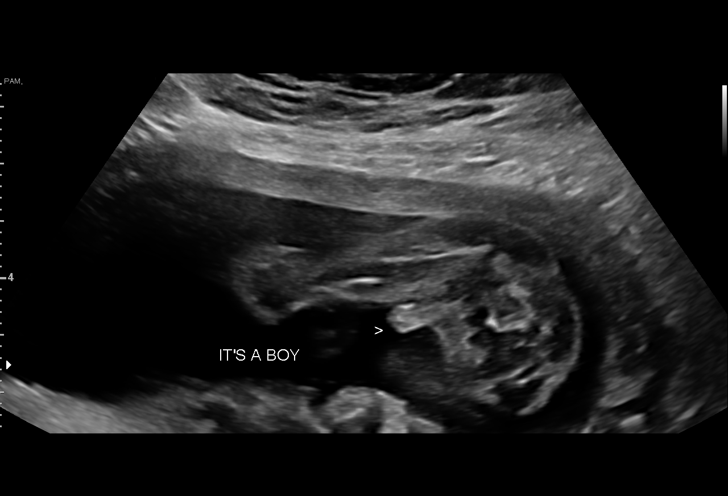
[im 15/29]
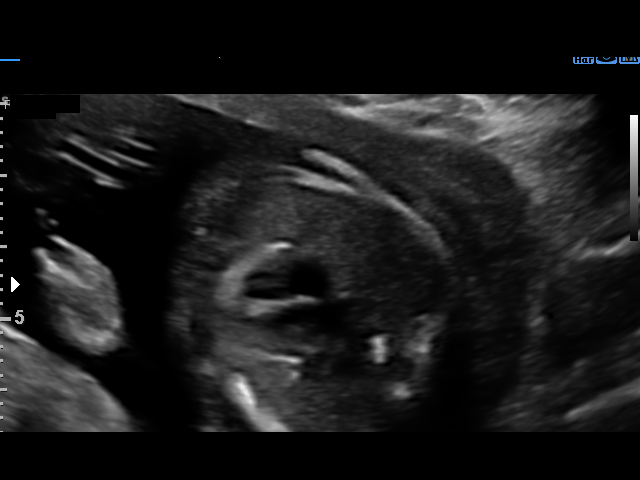
[im 16/29]
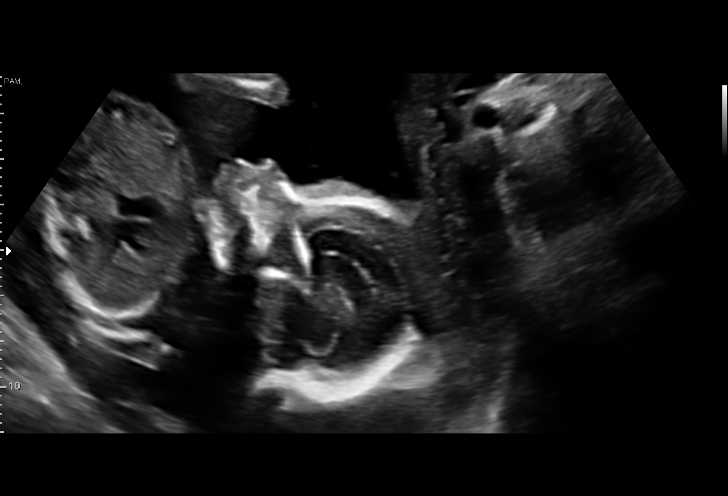
[im 18/29]
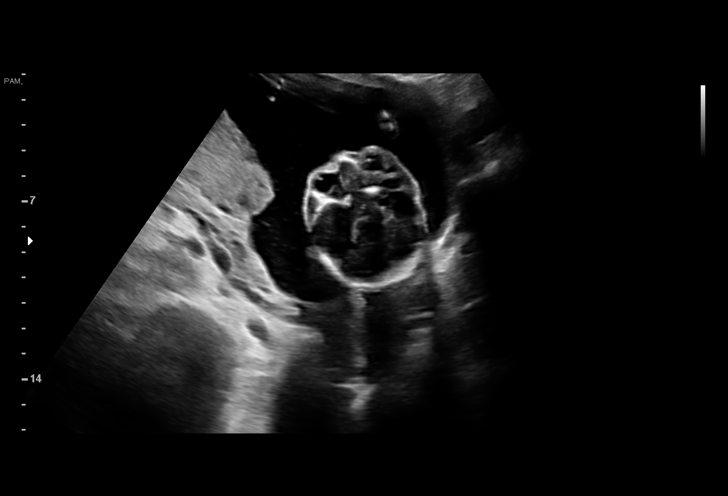
[im 20/29]
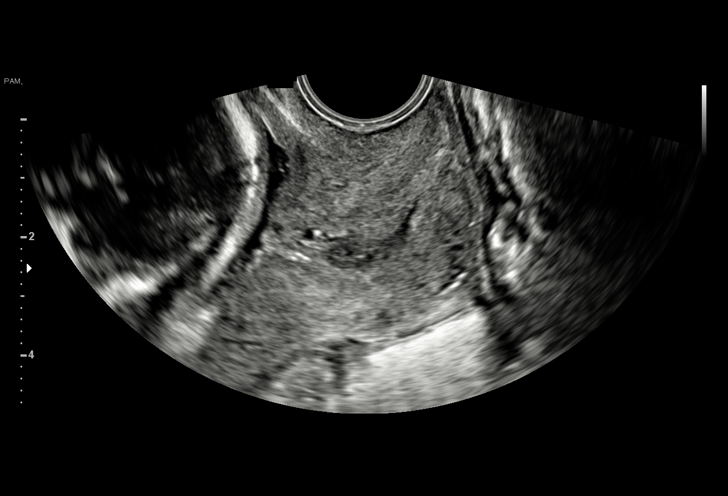
[im 22/29]
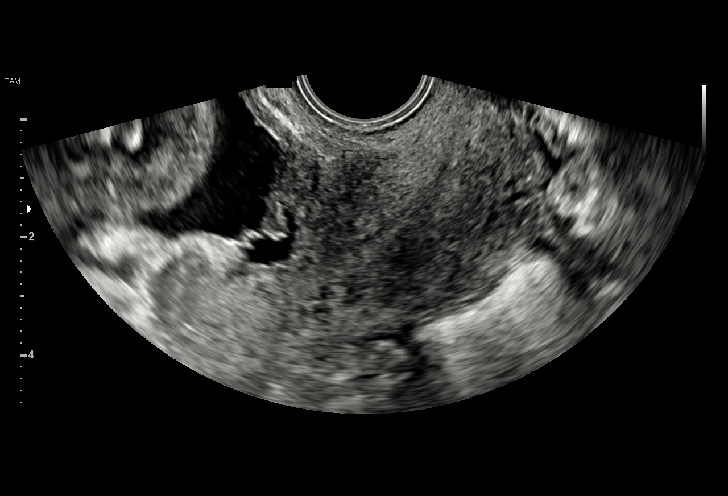
[im 24/29]
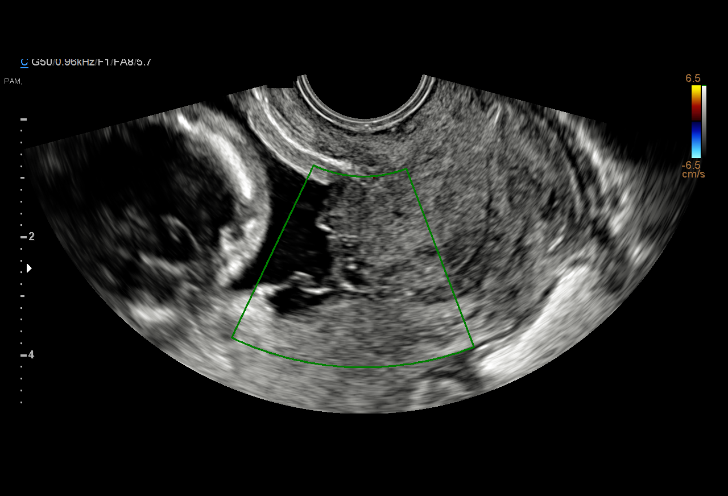
[im 26/29]
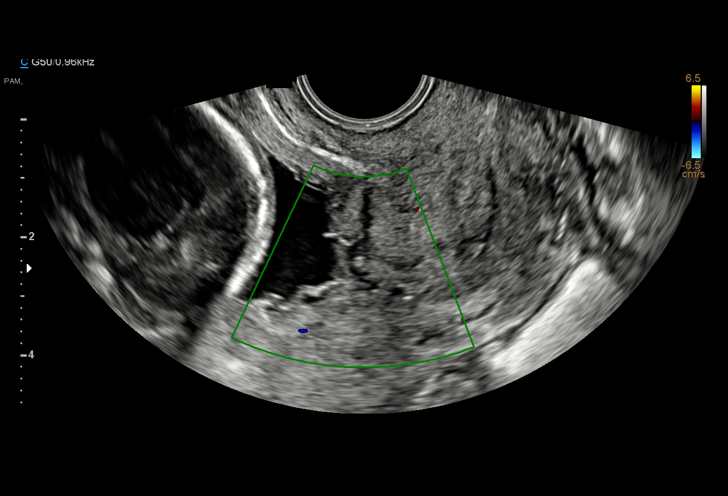
[im 29/29]
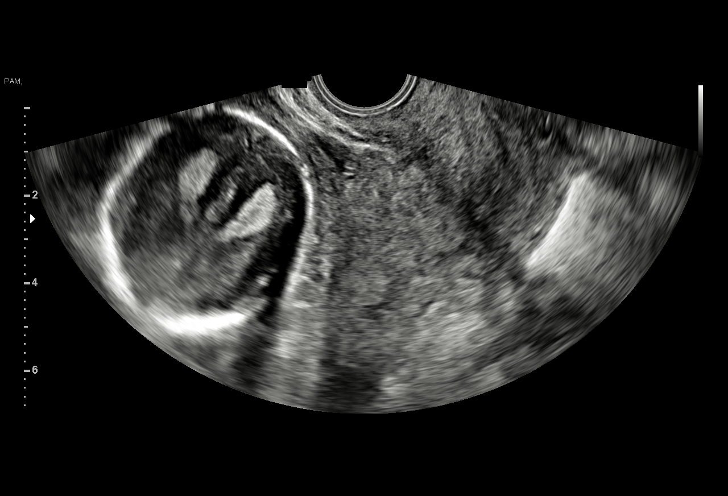

[15 of 28 positions shown; findings below may reference images not displayed]

KARUNAKARAN

1  DAYANA MARKS         [id number]      [id number]     [id number]
2  DAYANA MARKS         [id number]      [id number]     [id number]
Indications

19 weeks gestation of pregnancy
Vaginal bleeding in pregnancy, second
trimester
Previous cesarean delivery, antepartum
OB History

Gravidity:    4         Term:   2         SAB:   1
Living:       2
Fetal Evaluation

Num Of Fetuses:     1
Fetal Heart         141
Rate(bpm):
Cardiac Activity:   Observed
Presentation:       Cephalic
Placenta:           Posterior, above cervical os

Amniotic Fluid
AFI FV:      Subjectively within normal limits

Largest Pocket(cm)
4.1
Gestational Age

Clinical EDD:  19w 5d                                        EDD:   10/11/16
Best:          19w 5d     Det. By:  Clinical EDD             EDD:   10/11/16
Anatomy
Other:  Fetus appears to be a male.
Cervix Uterus Adnexa

Cervix
Length:            4.1  cm.
Normal appearance by transvaginal scan
Impression

Single IUP at 19w 5d
Limited ultrasound performed due to vaginal bleeding
Posterior placenta without previa
No subchorionic fluid collections were appreciated
Normal amniotic fluid volume
Cervical length 4.1 cm without funneling (transvaginal)
Recommendations

Follow-up ultrasounds as clinically indicated.

## 2019-02-18 ENCOUNTER — Other Ambulatory Visit: Payer: Self-pay

## 2019-02-18 DIAGNOSIS — Z20822 Contact with and (suspected) exposure to covid-19: Secondary | ICD-10-CM

## 2019-02-19 LAB — NOVEL CORONAVIRUS, NAA: SARS-CoV-2, NAA: NOT DETECTED

## 2019-06-10 ENCOUNTER — Encounter (HOSPITAL_COMMUNITY): Payer: Self-pay | Admitting: *Deleted

## 2019-06-10 ENCOUNTER — Emergency Department (HOSPITAL_COMMUNITY): Payer: 59

## 2019-06-10 ENCOUNTER — Other Ambulatory Visit: Payer: Self-pay

## 2019-06-10 ENCOUNTER — Emergency Department (HOSPITAL_COMMUNITY)
Admission: EM | Admit: 2019-06-10 | Discharge: 2019-06-10 | Disposition: A | Discharge status: Home / Self Care | Payer: 59 | Attending: Emergency Medicine | Admitting: Emergency Medicine

## 2019-06-10 DIAGNOSIS — F17210 Nicotine dependence, cigarettes, uncomplicated: Secondary | ICD-10-CM | POA: Insufficient documentation

## 2019-06-10 DIAGNOSIS — Z79899 Other long term (current) drug therapy: Secondary | ICD-10-CM | POA: Insufficient documentation

## 2019-06-10 DIAGNOSIS — R509 Fever, unspecified: Secondary | ICD-10-CM | POA: Insufficient documentation

## 2019-06-10 DIAGNOSIS — Z20822 Contact with and (suspected) exposure to covid-19: Secondary | ICD-10-CM | POA: Insufficient documentation

## 2019-06-10 DIAGNOSIS — M545 Low back pain, unspecified: Secondary | ICD-10-CM

## 2019-06-10 DIAGNOSIS — R319 Hematuria, unspecified: Secondary | ICD-10-CM | POA: Insufficient documentation

## 2019-06-10 DIAGNOSIS — R55 Syncope and collapse: Secondary | ICD-10-CM | POA: Insufficient documentation

## 2019-06-10 LAB — CBC
HCT: 39.7 % (ref 36.0–46.0)
Hemoglobin: 12.1 g/dL (ref 12.0–15.0)
MCH: 22.2 pg — ABNORMAL LOW (ref 26.0–34.0)
MCHC: 30.5 g/dL (ref 30.0–36.0)
MCV: 72.7 fL — ABNORMAL LOW (ref 80.0–100.0)
Platelets: 275 10*3/uL (ref 150–400)
RBC: 5.46 MIL/uL — ABNORMAL HIGH (ref 3.87–5.11)
RDW: 14.6 % (ref 11.5–15.5)
WBC: 11.3 10*3/uL — ABNORMAL HIGH (ref 4.0–10.5)
nRBC: 0 % (ref 0.0–0.2)

## 2019-06-10 LAB — URINALYSIS, ROUTINE W REFLEX MICROSCOPIC
Bilirubin Urine: NEGATIVE
Glucose, UA: NEGATIVE mg/dL
Ketones, ur: 20 mg/dL — AB
Leukocytes,Ua: NEGATIVE
Nitrite: NEGATIVE
Protein, ur: 30 mg/dL — AB
RBC / HPF: >50 RBC/hpf — ABNORMAL HIGH (ref 0–5)
Specific Gravity, Urine: 1.026 (ref 1.005–1.030)
pH: 5 (ref 5.0–8.0)

## 2019-06-10 LAB — BASIC METABOLIC PANEL
Anion gap: 9 (ref 5–15)
Anion gap: 8 (ref 5–15)
BUN: 10 mg/dL (ref 6–20)
BUN: 10 mg/dL (ref 6–20)
CO2: 23 mmol/L (ref 22–32)
CO2: 25 mmol/L (ref 22–32)
Calcium: 9 mg/dL (ref 8.9–10.3)
Calcium: 9.4 mg/dL (ref 8.9–10.3)
Chloride: 103 mmol/L (ref 98–111)
Chloride: 103 mmol/L (ref 98–111)
Creatinine, Ser: 0.8 mg/dL (ref 0.44–1.00)
Creatinine, Ser: 0.82 mg/dL (ref 0.44–1.00)
GFR calc Af Amer: >60 mL/min (ref >60)
GFR calc Af Amer: >60 mL/min (ref >60)
GFR calc non Af Amer: >60 mL/min (ref >60)
GFR calc non Af Amer: >60 mL/min (ref >60)
Glucose, Bld: 92 mg/dL (ref 70–99)
Glucose, Bld: 95 mg/dL (ref 70–99)
Potassium: 6.2 mmol/L — ABNORMAL HIGH (ref 3.5–5.1)
Potassium: 3.7 mmol/L (ref 3.5–5.1)
Sodium: 135 mmol/L (ref 135–145)
Sodium: 136 mmol/L (ref 135–145)

## 2019-06-10 LAB — I-STAT BETA HCG BLOOD, ED (MC, WL, AP ONLY): I-stat hCG, quantitative: <5 m[IU]/mL (ref <5)

## 2019-06-10 LAB — CBG MONITORING, ED: Glucose-Capillary: 135 mg/dL — ABNORMAL HIGH (ref 70–99)

## 2019-06-10 LAB — POC SARS CORONAVIRUS 2 AG -  ED: SARS Coronavirus 2 Ag: NEGATIVE

## 2019-06-10 LAB — SARS CORONAVIRUS 2 (TAT 6-24 HRS): SARS Coronavirus 2: NEGATIVE

## 2019-06-10 MED ORDER — NAPROXEN 500 MG PO TABS: 500.0000 mg | ORAL_TABLET | Freq: Two times a day (BID) | ORAL | 0 refills | Status: DC

## 2019-06-10 MED ORDER — SODIUM CHLORIDE 0.9% FLUSH: 3.0000 mL | Freq: Once | INTRAVENOUS | Status: DC

## 2019-06-10 MED ORDER — ACETAMINOPHEN 325 MG PO TABS
650.0000 mg | ORAL_TABLET | Freq: Once | ORAL | Status: AC
  Administered 2019-06-10: 650 mg via ORAL
  Filled 2019-06-10: qty 2

## 2019-06-10 MED ORDER — METHOCARBAMOL 500 MG PO TABS: 500.0000 mg | ORAL_TABLET | Freq: Three times a day (TID) | ORAL | 0 refills | Status: DC | PRN
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
## 2019-06-10 NOTE — ED Notes (Signed)
Patient verbalizes understanding of discharge instructions. Opportunity for questioning and answers were provided. Armband removed by staff, pt discharged from ED. Pt. ambulatory and discharged home.  

## 2019-06-10 NOTE — Discharge Instructions (Addendum)
You were seen in the emergency department today for back pain as well as several other symptoms.  Your work-up was overall reassuring.  Your urine did have blood in it, you will need to have this rechecked by primary care provider.  We tested you for Covid, we will call you if these results are positive-please quarantine in the meantime.  We are unsure of the exact cause of your discomfort.  We are sending you home with the following medicines to help with your symptoms:  - Naproxen is a nonsteroidal anti-inflammatory medication that will help with pain and swelling. Be sure to take this medication as prescribed with food, 1 pill every 12 hours,  It should be taken with food, as it can cause stomach upset, and more seriously, stomach bleeding. Do not take other nonsteroidal anti-inflammatory medications with this such as Advil, Motrin, Aleve, Mobic, Goodie Powder, or Motrin.    - Robaxin is the muscle relaxer I have prescribed, this is meant to help with muscle tightness. Be aware that this medication may make you drowsy therefore the first time you take this it should be at a time you are in an environment where you can rest. Do not drive or operate heavy machinery when taking this medication. Do not drink alcohol or take other sedating medications with this medicine such as narcotics or benzodiazepines.   You make take Tylenol per over the counter dosing with these medications.   We have prescribed you new medication(s) today. Discuss the medications prescribed today with your pharmacist as they can have adverse effects and interactions with your other medicines including over the counter and prescribed medications. Seek medical evaluation if you start to experience new or abnormal symptoms after taking one of these medicines, seek care immediately if you start to experience difficulty breathing, feeling of your throat closing, facial swelling, or rash as these could be indications of a more serious  allergic reaction   Please follow-up with your primary care provider within 3 days for reevaluation.  Return to the ER for new or worsening symptoms including but not limited to increased pain, fever, passing out, chest pain, trouble breathing, numbness, weakness, loss of control of bowel or bladder function, or any other concerns.

## 2019-06-10 NOTE — ED Triage Notes (Addendum)
Multiple complaints. Reports right side lower back pain since Saturday. Denies urinary symptoms. Reports fever, bodyaches, lack of appetite and lack of taste.  Had syncopal episode while checking in, related to her pain level. States hx of anxiety.

## 2019-06-10 NOTE — ED Provider Notes (Signed)
19:00: Assumed care of patient from Alyse Low PA-C at change of shift pending CT renal study & discharge home.   Please see prior provider note for full H&P. Briefly patient is here with chief complaint of right lower back pain, also has several other sxs including fever, body aches, poor appetite, difficulty with taste.   Physical Exam  BP 130/89 (BP Location: Right Arm)   Pulse 73   Temp 98.3 F (36.8 C) (Oral)   Resp 14   SpO2 100%   Physical Exam Vitals and nursing note reviewed.  Constitutional:      General: She is not in acute distress.    Appearance: She is well-developed.  HENT:     Head: Normocephalic and atraumatic.  Eyes:     General:        Right eye: No discharge.        Left eye: No discharge.     Conjunctiva/sclera: Conjunctivae normal.  Musculoskeletal:     Comments: No midline spinal tenderness.  Right lumbar paraspinal muscle tenderness to palpation.  Neurological:     Mental Status: She is alert.     Comments: Clear speech.  Patient is ambulatory.  Psychiatric:        Behavior: Behavior normal.        Thought Content: Thought content normal.     ED Course/Procedures     Procedures  Results for orders placed or performed during the hospital encounter of 06/10/19  CBC  Result Value Ref Range   WBC 11.3 (H) 4.0 - 10.5 K/uL   RBC 5.46 (H) 3.87 - 5.11 MIL/uL   Hemoglobin 12.1 12.0 - 15.0 g/dL   HCT 39.7 36.0 - 46.0 %   MCV 72.7 (L) 80.0 - 100.0 fL   MCH 22.2 (L) 26.0 - 34.0 pg   MCHC 30.5 30.0 - 36.0 g/dL   RDW 14.6 11.5 - 15.5 %   Platelets 275 150 - 400 K/uL   nRBC 0.0 0.0 - 0.2 %  Urinalysis, Routine w reflex microscopic  Result Value Ref Range   Color, Urine AMBER (A) YELLOW   APPearance HAZY (A) CLEAR   Specific Gravity, Urine 1.026 1.005 - 1.030   pH 5.0 5.0 - 8.0   Glucose, UA NEGATIVE NEGATIVE mg/dL   Hgb urine dipstick MODERATE (A) NEGATIVE   Bilirubin Urine NEGATIVE NEGATIVE   Ketones, ur 20 (A) NEGATIVE mg/dL   Protein, ur 30  (A) NEGATIVE mg/dL   Nitrite NEGATIVE NEGATIVE   Leukocytes,Ua NEGATIVE NEGATIVE   RBC / HPF >50 (H) 0 - 5 RBC/hpf   WBC, UA 0-5 0 - 5 WBC/hpf   Bacteria, UA RARE (A) NONE SEEN   Squamous Epithelial / LPF 6-10 0 - 5   Mucus PRESENT   Basic metabolic panel  Result Value Ref Range   Sodium 135 135 - 145 mmol/L   Potassium 6.2 (H) 3.5 - 5.1 mmol/L   Chloride 103 98 - 111 mmol/L   CO2 23 22 - 32 mmol/L   Glucose, Bld 92 70 - 99 mg/dL   BUN 10 6 - 20 mg/dL   Creatinine, Ser 0.80 0.44 - 1.00 mg/dL   Calcium 9.0 8.9 - 10.3 mg/dL   GFR calc non Af Amer >60 >60 mL/min   GFR calc Af Amer >60 >60 mL/min   Anion gap 9 5 - 15  Basic metabolic panel  Result Value Ref Range   Sodium 136 135 - 145 mmol/L   Potassium 3.7 3.5 -  5.1 mmol/L   Chloride 103 98 - 111 mmol/L   CO2 25 22 - 32 mmol/L   Glucose, Bld 95 70 - 99 mg/dL   BUN 10 6 - 20 mg/dL   Creatinine, Ser 0.82 0.44 - 1.00 mg/dL   Calcium 9.4 8.9 - 10.3 mg/dL   GFR calc non Af Amer >60 >60 mL/min   GFR calc Af Amer >60 >60 mL/min   Anion gap 8 5 - 15  CBG monitoring, ED  Result Value Ref Range   Glucose-Capillary 135 (H) 70 - 99 mg/dL  I-Stat beta hCG blood, ED  Result Value Ref Range   I-stat hCG, quantitative <5.0 <5 mIU/mL   Comment 3          POC SARS Coronavirus 2 Ag-ED - Nasal Swab (BD Veritor Kit)  Result Value Ref Range   SARS Coronavirus 2 Ag NEGATIVE NEGATIVE   CT Renal Stone Study  Result Date: 06/10/2019 CLINICAL DATA:  Flank pain. EXAM: CT ABDOMEN AND PELVIS WITHOUT CONTRAST TECHNIQUE: Multidetector CT imaging of the abdomen and pelvis was performed following the standard protocol without IV contrast. COMPARISON:  None. FINDINGS: Lower chest: No acute abnormality. Hepatobiliary: No focal liver abnormality is seen. No gallstones, gallbladder wall thickening, or biliary dilatation. Pancreas: Unremarkable. No pancreatic ductal dilatation or surrounding inflammatory changes. Spleen: Normal in size without focal  abnormality. Adrenals/Urinary Tract: Adrenal glands are unremarkable. Kidneys are normal, without renal calculi, focal lesion, or hydronephrosis. Bladder is unremarkable. Stomach/Bowel: Stomach is within normal limits. The appendix is not clearly identified. No evidence of bowel wall thickening, distention, or inflammatory changes. Vascular/Lymphatic: No significant vascular findings are present. No enlarged abdominal or pelvic lymph nodes. Reproductive: Uterus and bilateral adnexa are unremarkable. Other: A small amount of posterior pelvic free fluid is seen. Musculoskeletal: No fracture is seen. IMPRESSION: 1. Small amount of posterior pelvic free fluid. 2. Otherwise, unremarkable CT of the abdomen and pelvis. Electronically Signed   By: Virgina Norfolk M.D.   On: 06/10/2019 19:20     MDM   Work-up reviewed: CBC: Mild leukocytosis at 11.3. BNP: Unremarkable UA: Hematuria. Preg test: Negative  CT A/P:  1. Small amount of posterior pelvic free fluid. 2. Otherwise, unremarkable CT of the abdomen and pelvis.  Patient denies pelvic/abdominal pain. She states she is not currently menstruating but that she has the nexplanon and this can be random. Unclear definitive etiology to her overall sxs, she does have a reassuring work up in the ED, COVID test pending which remains possibility also considering musculoskeletal, per discussion with prior provider will discharge home with muscle relaxants and anti-inflammatories.  Her Covid test is pending.  Recommended PCP recheck of her urinalysis as she does have hematuria as well as general follow-up. I discussed results, treatment plan, need for follow-up, and return precautions with the patient. Provided opportunity for questions, patient confirmed understanding and is in agreement with plan.            Leafy Kindle 06/10/19 2001    Lucrezia Starch, MD 06/12/19 [phone]

## 2019-06-12 LAB — URINE CULTURE: Culture: >=100000 — AB

## 2019-06-13 ENCOUNTER — Telehealth: Payer: Self-pay | Admitting: *Deleted

## 2019-06-13 NOTE — Telephone Encounter (Signed)
Post ED Visit - Positive Culture Follow-up  Culture report reviewed by antimicrobial stewardship pharmacist: [facility] []  , Pharm.D. []  Enzo Bi, Pharm.D., BCPS AQ-ID []  , Pharm.D., BCPS []  Celedonio Miyamoto, Pharm.D., BCPS []  [place], Garvin Fila.D., BCPS, AAHIVP []  , Pharm.D., BCPS, AAHIVP []  Georgina Pillion, PharmD, BCPS []  , PharmD, BCPS []  [place], PharmD, BCPS []  [address], PharmD []  , PharmD, BCPS []  Estella Husk, PharmD  Pharmacy Team []  Lysle Pearl, PharmD []  , PharmD []  Phillips Climes, PharmD []  , Rph []  Agapito Games) , PharmD []  Verlan Friends, PharmD []  , PharmD []  Mervyn Gay, PharmD []  , PharmD []  Vinnie Level, PharmD []  [facility], PharmD []  , PharmD []  Len Childs, PharmD   Positive urine culture No urinary symptoms and no further patient follow-up is required at this time.  [facility] 06/13/2019, 10:32 AM

## 2022-05-09 ENCOUNTER — Ambulatory Visit: Payer: Medicaid Other | Admitting: Internal Medicine

## 2022-12-08 ENCOUNTER — Encounter (HOSPITAL_COMMUNITY): Payer: Self-pay | Admitting: Emergency Medicine

## 2022-12-08 ENCOUNTER — Emergency Department (HOSPITAL_COMMUNITY)
Admission: EM | Admit: 2022-12-08 | Discharge: 2022-12-09 | Disposition: A | Discharge status: Home / Self Care | Payer: Medicaid Other | Attending: Emergency Medicine | Admitting: Emergency Medicine

## 2022-12-08 ENCOUNTER — Emergency Department (HOSPITAL_COMMUNITY): Payer: Medicaid Other

## 2022-12-08 DIAGNOSIS — R1032 Left lower quadrant pain: Secondary | ICD-10-CM | POA: Diagnosis not present

## 2022-12-08 DIAGNOSIS — N83202 Unspecified ovarian cyst, left side: Secondary | ICD-10-CM | POA: Diagnosis not present

## 2022-12-08 DIAGNOSIS — N281 Cyst of kidney, acquired: Secondary | ICD-10-CM | POA: Diagnosis not present

## 2022-12-08 DIAGNOSIS — N83299 Other ovarian cyst, unspecified side: Secondary | ICD-10-CM

## 2022-12-08 DIAGNOSIS — E871 Hypo-osmolality and hyponatremia: Secondary | ICD-10-CM | POA: Insufficient documentation

## 2022-12-08 DIAGNOSIS — N644 Mastodynia: Secondary | ICD-10-CM | POA: Diagnosis not present

## 2022-12-08 LAB — CBC WITH DIFFERENTIAL/PLATELET
Abs Immature Granulocytes: 0.02 10*3/uL (ref 0.00–0.07)
Basophils Absolute: 0 10*3/uL (ref 0.0–0.1)
Basophils Relative: 0 %
Eosinophils Absolute: 0.1 10*3/uL (ref 0.0–0.5)
Eosinophils Relative: 1 %
HCT: 37.2 % (ref 36.0–46.0)
Hemoglobin: 11.4 g/dL — ABNORMAL LOW (ref 12.0–15.0)
Immature Granulocytes: 0 %
Lymphocytes Relative: 35 %
Lymphs Abs: 3.3 10*3/uL (ref 0.7–4.0)
MCH: 22.1 pg — ABNORMAL LOW (ref 26.0–34.0)
MCHC: 30.6 g/dL (ref 30.0–36.0)
MCV: 72 fL — ABNORMAL LOW (ref 80.0–100.0)
Monocytes Absolute: 1 10*3/uL (ref 0.1–1.0)
Monocytes Relative: 11 %
Neutro Abs: 5.1 10*3/uL (ref 1.7–7.7)
Neutrophils Relative %: 53 %
Platelets: 298 10*3/uL (ref 150–400)
RBC: 5.17 MIL/uL — ABNORMAL HIGH (ref 3.87–5.11)
RDW: 14.9 % (ref 11.5–15.5)
WBC: 9.6 10*3/uL (ref 4.0–10.5)
nRBC: 0 % (ref 0.0–0.2)

## 2022-12-08 LAB — URINALYSIS, ROUTINE W REFLEX MICROSCOPIC
Bacteria, UA: NONE SEEN
Glucose, UA: NEGATIVE mg/dL
Hgb urine dipstick: NEGATIVE
Ketones, ur: 5 mg/dL — AB
Nitrite: NEGATIVE
Protein, ur: 100 mg/dL — AB
Specific Gravity, Urine: 1.032 — ABNORMAL HIGH (ref 1.005–1.030)
pH: 7 (ref 5.0–8.0)

## 2022-12-08 LAB — HCG, SERUM, QUALITATIVE: Preg, Serum: NEGATIVE

## 2022-12-08 LAB — COMPREHENSIVE METABOLIC PANEL
ALT: 16 U/L (ref 0–44)
AST: 17 U/L (ref 15–41)
Albumin: 3.7 g/dL (ref 3.5–5.0)
Alkaline Phosphatase: 47 U/L (ref 38–126)
Anion gap: 7 (ref 5–15)
BUN: 14 mg/dL (ref 6–20)
CO2: 22 mmol/L (ref 22–32)
Calcium: 8.2 mg/dL — ABNORMAL LOW (ref 8.9–10.3)
Chloride: 104 mmol/L (ref 98–111)
Creatinine, Ser: 0.84 mg/dL (ref 0.44–1.00)
GFR, Estimated: >60 mL/min (ref >60)
Glucose, Bld: 106 mg/dL — ABNORMAL HIGH (ref 70–99)
Potassium: 3.5 mmol/L (ref 3.5–5.1)
Sodium: 133 mmol/L — ABNORMAL LOW (ref 135–145)
Total Bilirubin: 0.3 mg/dL (ref 0.3–1.2)
Total Protein: 7.1 g/dL (ref 6.5–8.1)

## 2022-12-08 LAB — LIPASE, BLOOD: Lipase: 30 U/L (ref 11–51)

## 2022-12-08 MED ORDER — ONDANSETRON 4 MG PO TBDP
4.0000 mg | ORAL_TABLET | Freq: Once | ORAL | Status: AC
  Administered 2022-12-08: 4 mg via ORAL
  Filled 2022-12-08: qty 1

## 2022-12-08 MED ORDER — SODIUM CHLORIDE (PF) 0.9 % IJ SOLN
INTRAMUSCULAR | Status: AC
  Filled 2022-12-08: qty 50

## 2022-12-08 MED ORDER — IOHEXOL 300 MG/ML  SOLN
100.0000 mL | Freq: Once | INTRAMUSCULAR | Status: AC | PRN
  Administered 2022-12-08: 100 mL via INTRAVENOUS

## 2022-12-08 MED ORDER — ACETAMINOPHEN 500 MG PO TABS: 1000.0000 mg | ORAL_TABLET | Freq: Once | ORAL | Status: DC

## 2022-12-08 MED ORDER — SODIUM CHLORIDE 0.9 % IV BOLUS
1000.0000 mL | Freq: Once | INTRAVENOUS | Status: AC
  Administered 2022-12-08: 1000 mL via INTRAVENOUS

## 2022-12-08 MED ORDER — MORPHINE SULFATE (PF) 2 MG/ML IV SOLN
2.0000 mg | Freq: Once | INTRAVENOUS | Status: AC
  Administered 2022-12-08: 2 mg via INTRAVENOUS
  Filled 2022-12-08: qty 1

## 2022-12-08 NOTE — ED Provider Notes (Signed)
 [place] EMERGENCY DEPARTMENT AT [facility] Provider Note   CSN: [id number] Arrival date & time: 12/08/22  2012     History Chief Complaint  Patient presents with   Abdominal Pain    Cathy Elliott is a 32 y.o. female.  Patient presents to the emergency department complaints of abdominal pain.  Reports has been ongoing for several weeks at this point.  Endorses that she has missed her menstrual cycle with last period occurring in early/mid May.  Patient is currently sexually active reports that she has a Nexplanon.  Denies any clear urinary symptoms.  Endorses some nausea but denies vomiting.  Does report that she did drink alcohol yesterday and had an episode of vomiting afterwards but that has since resolved.  No prior history of any GI abnormalities.  She is also endorsing some breast tenderness.   Abdominal Pain Associated symptoms: nausea        Home Medications Prior to Admission medications   Medication Sig Start Date End Date Taking? Authorizing Provider  acetaminophen (TYLENOL) 500 MG tablet Take 500 mg by mouth every 6 (six) hours as needed for moderate pain.   Yes [provider]  iron polysaccharides (NIFEREX) 150 MG capsule Take 1 capsule (150 mg total) by mouth 2 (two) times daily. Patient not taking: Reported on 12/08/2022 10/14/16   Lovena Neighbours, MD  methocarbamol (ROBAXIN) 500 MG tablet Take 1 tablet (500 mg total) by mouth every 8 (eight) hours as needed for muscle spasms. Patient not taking: Reported on 12/08/2022 06/10/19   Petrucelli, Lelon Mast R, PA-C  naproxen (NAPROSYN) 500 MG tablet Take 1 tablet (500 mg total) by mouth 2 (two) times daily. Patient not taking: Reported on 12/08/2022 06/10/19   Petrucelli, Pleas Koch, PA-C      Allergies    Patient has no known allergies.    Review of Systems   Review of Systems  Gastrointestinal:  Positive for abdominal pain and nausea.  All other systems reviewed and are  negative.   Physical Exam Updated Vital Signs BP 122/60   Pulse 84   Temp 98.2 F (36.8 C) (Oral)   Resp 15   Ht 5\' 1"  (1.549 m)   Wt 86.2 kg   LMP 10/17/2022 Comment: negative HCG qualitative 12/08/22  SpO2 100%   BMI 35.90 kg/m  Physical Exam Vitals and nursing note reviewed.  Constitutional:      General: She is not in acute distress.    Appearance: She is well-developed.  HENT:     Head: Normocephalic and atraumatic.  Eyes:     Conjunctiva/sclera: Conjunctivae normal.  Cardiovascular:     Rate and Rhythm: Normal rate and regular rhythm.     Heart sounds: No murmur heard. Pulmonary:     Effort: Pulmonary effort is normal. No respiratory distress.     Breath sounds: Normal breath sounds.  Abdominal:     General: Bowel sounds are normal.     Palpations: Abdomen is soft. There is no splenomegaly or mass.     Tenderness: There is abdominal tenderness in the left upper quadrant and left lower quadrant.  Musculoskeletal:        General: No swelling.     Cervical back: Neck supple.  Skin:    General: Skin is warm and dry.     Capillary Refill: Capillary refill takes less than 2 seconds.  Neurological:     Mental Status: She is alert.  Psychiatric:        Mood  and Affect: Mood normal.     ED Results / Procedures / Treatments   Labs (all labs ordered are listed, but only abnormal results are displayed) Labs Reviewed  URINALYSIS, ROUTINE W REFLEX MICROSCOPIC - Abnormal; Notable for the following components:      Result Value   Specific Gravity, Urine 1.032 (*)    Bilirubin Urine SMALL (*)    Ketones, ur 5 (*)    Protein, ur 100 (*)    Leukocytes,Ua TRACE (*)    All other components within normal limits  CBC WITH DIFFERENTIAL/PLATELET - Abnormal; Notable for the following components:   RBC 5.17 (*)    Hemoglobin 11.4 (*)    MCV 72.0 (*)    MCH 22.1 (*)    All other components within normal limits  COMPREHENSIVE METABOLIC PANEL - Abnormal; Notable for the  following components:   Sodium 133 (*)    Glucose, Bld 106 (*)    Calcium 8.2 (*)    All other components within normal limits  HCG, SERUM, QUALITATIVE  LIPASE, BLOOD  POC URINE PREG, ED    EKG None  Radiology CT ABDOMEN PELVIS W CONTRAST  Result Date: 12/08/2022 CLINICAL DATA:  Left lower quadrant abdominal pain, nausea EXAM: CT ABDOMEN AND PELVIS WITH CONTRAST TECHNIQUE: Multidetector CT imaging of the abdomen and pelvis was performed using the standard protocol following bolus administration of intravenous contrast. RADIATION DOSE REDUCTION: This exam was performed according to the departmental dose-optimization program which includes automated exposure control, adjustment of the mA and/or kV according to patient size and/or use of iterative reconstruction technique. CONTRAST:  OMNIPAQUE IOHEXOL 300 MG/ML  SOLN COMPARISON:  06/10/2019 FINDINGS: Lower chest: Lung bases are clear. Hepatobiliary: Liver is within normal limits. Gallbladder unremarkable. No intrahepatic or extrahepatic duct dilatation. Pancreas: Within normal limits. Spleen: Within normal limits. Adrenals/Urinary Tract: Adrenal glands are within normal limits. 6 mm cyst in the medial left upper kidney (series 2/image 19), measuring simple fluid density, benign (Bosniak I). Right kidney is within normal limits. No follow-up recommended. No hydronephrosis. Stomach/Bowel: Bladder is within normal limits. Vascular/Lymphatic: Stomach is within normal limits. No evidence of bowel obstruction. Normal appendix (series 2/image 61). No colonic wall thickening or inflammatory changes. Reproductive: Uterus is within normal limits. Right ovary is within normal limits. Left ovary is notable for a 4.7 x 3.1 cm lesion which measures just higher than simple fluid density, favoring a benign hemorrhagic cyst, but warranting further evaluation given left lower quadrant pain. Other: No abdominopelvic ascites. Musculoskeletal: Visualized osseous  structures are within normal limits. IMPRESSION: 4.7 cm left ovarian lesion, favoring a benign hemorrhagic cyst, but warranting further evaluation given left lower quadrant pain. Pelvic ultrasound with Doppler is suggested for further evaluation. Otherwise negative CT abdomen/pelvis. Electronically Signed   By: Charline Bills M.D.   On: 12/08/2022 23:55    Procedures Procedures   Medications Ordered in ED Medications  morphine (PF) 2 MG/ML injection 2 mg (has no administration in time range)  ondansetron (ZOFRAN-ODT) disintegrating tablet 4 mg (4 mg Oral Given 12/08/22 2113)  morphine (PF) 2 MG/ML injection 2 mg (2 mg Intravenous Given 12/08/22 2203)  sodium chloride 0.9 % bolus 1,000 mL (1,000 mLs Intravenous New Bag/Given 12/08/22 2255)  iohexol (OMNIPAQUE) 300 MG/ML solution 100 mL (100 mLs Intravenous Contrast Given 12/08/22 2335)    ED Course/ Medical Decision Making/ A&P  Medical Decision Making Amount and/or Complexity of Data Reviewed Labs: ordered.  Risk Prescription drug management.   This patient presents to the ED for concern of abdominal pain.  Differential diagnosis includes pregnancy, bowel obstruction, diverticulitis, ovarian cyst, tubo-ovarian abscess   Lab Tests:  I Ordered, and personally interpreted labs.  The pertinent results include: CBC unremarkable, CMP with mild hyponatremia, UA without obvious infection with leukocytes present, hCG negative, lipase normal   Imaging Studies ordered:  I ordered imaging studies including CT abdomen pelvis, ultrasound pelvic with transvaginal torsion I independently visualized and interpreted imaging which showed 4.7cm left ovarian cyst, ultrasound pending I agree with the radiologist interpretation   Medicines ordered and prescription drug management:  I ordered medication including fluids, morphine, Zofran for dehydration, pain, nausea Reevaluation of the patient after these medicines showed that  the patient improved I have reviewed the patients home medicines and have made adjustments as needed   Problem List / ED Course:  Patient presents to the emergency department complaints of abdominal pain.  Concerned about a missed period with last menstrual period estimated on May 13.  2 home pregnancy test negative with a current Nexplanon that is approximately 33 years old in place. Given concern for area of pain, will evaluate with lab workup and imaging. Initiated treatment with fluids, morphine, and Zofran. Labs are largely unremarkable without any significant electrolyte derangements or abnormalities in CBC noted.  Lipase also negative.  hCG negative.  UA without obvious signs of infection as well.  CT imaging showing signs of a left ovarian lesions/cyst of 4.7cm on the left ovary.  CT recommendations show evaluation with ultrasound but low concern for ovarian torsion at this time.  12:31 AM Care of Cathy Elliott transferred to [facility] and Dr. Nicanor Alcon at the end of my shift as the patient will require reassessment once labs/imaging have resulted. Patient presentation, ED course, and plan of care discussed with review of all pertinent labs and imaging. Please see his/her note for further details regarding further ED course and disposition. Plan at time of handoff is to await results of [place] of pelvis w/ transvaginal and torsion r/o. I anticipate that this patient will be able to discharge home but will need to await results of [place]. This may be altered or completely changed at the discretion of the oncoming team pending results of further workup.   Final Clinical Impression(s) / ED Diagnoses Final diagnoses:  Left lower quadrant abdominal pain    Rx / DC Orders ED Discharge Orders     None         Smitty Knudsen, PA-C 12/09/22 0032    Linwood Dibbles, MD 12/11/22 [phone]

## 2022-12-08 NOTE — ED Provider Notes (Incomplete)
[place] EMERGENCY DEPARTMENT AT [facility] Provider Note   CSN: [id number] Arrival date & time: 12/08/22  2012     History Chief Complaint  Patient presents with  . Abdominal Pain    Cathy Elliott is a 32 y.o. female.  Patient presents to the emergency department complaints of abdominal pain.  Reports has been ongoing for several weeks at this point.  Endorses that she has missed her menstrual cycle with last period occurring in early/mid May.  Patient is currently sexually active reports that she has a Nexplanon.  Denies any clear urinary symptoms.  Endorses some nausea but denies vomiting.  Does report that she did drink alcohol yesterday and had an episode of vomiting afterwards but that has since resolved.  No prior history of any GI abnormalities.  She is also endorsing some breast tenderness.   Abdominal Pain Associated symptoms: nausea        Home Medications Prior to Admission medications   Medication Sig Start Date End Date Taking? Authorizing Provider  iron polysaccharides (NIFEREX) 150 MG capsule Take 1 capsule (150 mg total) by mouth 2 (two) times daily. 10/14/16   Diallo, Lilia Argue, MD  methocarbamol (ROBAXIN) 500 MG tablet Take 1 tablet (500 mg total) by mouth every 8 (eight) hours as needed for muscle spasms. 06/10/19   Petrucelli, Samantha R, PA-C  naproxen (NAPROSYN) 500 MG tablet Take 1 tablet (500 mg total) by mouth 2 (two) times daily. 06/10/19   Petrucelli, Pleas Koch, PA-C      Allergies    Patient has no known allergies.    Review of Systems   Review of Systems  Gastrointestinal:  Positive for abdominal pain and nausea.  All other systems reviewed and are negative.   Physical Exam Updated Vital Signs BP 126/76   Pulse 88   Temp 98.3 F (36.8 C) (Oral)   Resp 17   Ht 5\' 1"  (1.549 m)   Wt 86.2 kg   LMP 10/17/2022   SpO2 100%   BMI 35.90 kg/m  Physical Exam Vitals and nursing note reviewed.  Constitutional:      General:  She is not in acute distress.    Appearance: She is well-developed.  HENT:     Head: Normocephalic and atraumatic.  Eyes:     Conjunctiva/sclera: Conjunctivae normal.  Cardiovascular:     Rate and Rhythm: Normal rate and regular rhythm.     Heart sounds: No murmur heard. Pulmonary:     Effort: Pulmonary effort is normal. No respiratory distress.     Breath sounds: Normal breath sounds.  Abdominal:     General: Bowel sounds are normal.     Palpations: Abdomen is soft. There is no splenomegaly or mass.     Tenderness: There is abdominal tenderness in the left upper quadrant and left lower quadrant.  Musculoskeletal:        General: No swelling.     Cervical back: Neck supple.  Skin:    General: Skin is warm and dry.     Capillary Refill: Capillary refill takes less than 2 seconds.  Neurological:     Mental Status: She is alert.  Psychiatric:        Mood and Affect: Mood normal.     ED Results / Procedures / Treatments   Labs (all labs ordered are listed, but only abnormal results are displayed) Labs Reviewed  URINALYSIS, ROUTINE W REFLEX MICROSCOPIC - Abnormal; Notable for the following components:  Result Value   Specific Gravity, Urine 1.032 (*)    Bilirubin Urine SMALL (*)    Ketones, ur 5 (*)    Protein, ur 100 (*)    Leukocytes,Ua TRACE (*)    All other components within normal limits  HCG, SERUM, QUALITATIVE  CBC WITH DIFFERENTIAL/PLATELET  COMPREHENSIVE METABOLIC PANEL  LIPASE, BLOOD  POC URINE PREG, ED    EKG None  Radiology No results found.  Procedures Procedures   Medications Ordered in ED Medications  ondansetron (ZOFRAN-ODT) disintegrating tablet 4 mg (4 mg Oral Given 12/08/22 2113)    ED Course/ Medical Decision Making/ A&P                           Medical Decision Making Amount and/or Complexity of Data Reviewed Labs: ordered.  Risk Prescription drug management.   This patient presents to the ED for concern of abdominal pain.   Differential diagnosis includes pregnancy, bowel obstruction, diverticulitis, ovarian cyst, tubo-ovarian abscess    Additional history obtained:  Additional history obtained from *** External records from outside source obtained and reviewed including ***   Lab Tests:  I Ordered, and personally interpreted labs.  The pertinent results include:  ***   Imaging Studies ordered:  I ordered imaging studies including ***  I independently visualized and interpreted imaging which showed *** I agree with the radiologist interpretation   Medicines ordered and prescription drug management:  I ordered medication including ***  for ***  Reevaluation of the patient after these medicines showed that the patient {resolved/improved/worsened:23923::"improved"} I have reviewed the patients home medicines and have made adjustments as needed   Problem List / ED Course:  ***   Social Determinants of Health:    Final Clinical Impression(s) / ED Diagnoses Final diagnoses:  None    Rx / DC Orders ED Discharge Orders     None

## 2022-12-08 NOTE — ED Triage Notes (Addendum)
Pt's last period May 13th. Lower abdominal pain for 2 weeks. Breasts tender, nausea. Has taken 2 preg tests and negative. She has nexplanon and it is 32 years old.

## 2022-12-09 ENCOUNTER — Emergency Department (HOSPITAL_COMMUNITY): Payer: Medicaid Other

## 2022-12-09 MED ORDER — MORPHINE SULFATE (PF) 2 MG/ML IV SOLN
2.0000 mg | Freq: Once | INTRAVENOUS | Status: AC
  Administered 2022-12-09: 2 mg via INTRAVENOUS
  Filled 2022-12-09: qty 1

## 2022-12-09 NOTE — Discharge Instructions (Signed)
You have a left ovarian cyst which is likely causing her pain, I recommend NSAIDs for your pain.  Please follow-up with OB/GYN for further evaluation and I given you the read of your ultrasound.  Come back to the emergency department if you develop chest pain, shortness of breath, severe abdominal pain, uncontrolled nausea, vomiting, diarrhea.

## 2022-12-09 NOTE — ED Notes (Signed)
US at bedside

## 2022-12-09 NOTE — ED Provider Notes (Signed)
Received at shift change from [facility]   Patient without significant medical history presenting with complaints of left lower abdominal pain going on for last 2 weeks, describes a cramp-like sensation, states is constant, does improve with ibuprofen, no associated nausea vomiting, still passing gas having normal bowel movements no urinary symptoms, denies any new vaginal discharge vaginal bleeding, states she has Nexplanon in, her last menstrual cycle was 2 months ago, states she is typically regular, no history of ovarian torsion or ectopic pregnancies, she is not having any complaints.  States she is feeling better after pain medication was given to her to by previous provider.  Previous provider follow-up on ultrasound discarded accordingly. Physical Exam  BP (!) 101/56   Pulse 90   Temp 98.3 F (36.8 C) (Oral)   Resp 15   Ht 5\' 1"  (1.549 m)   Wt 86.2 kg   LMP 10/17/2022 Comment: negative HCG qualitative 12/08/22  SpO2 98%   BMI 35.90 kg/m   Physical Exam Vitals and nursing note reviewed.  Constitutional:      General: She is not in acute distress.    Appearance: She is not ill-appearing.  HENT:     Head: Normocephalic and atraumatic.     Nose: No congestion.  Eyes:     Conjunctiva/sclera: Conjunctivae normal.  Cardiovascular:     Rate and Rhythm: Normal rate and regular rhythm.     Pulses: Normal pulses.     Heart sounds: No murmur heard.    No friction rub. No gallop.  Pulmonary:     Effort: No respiratory distress.     Breath sounds: No wheezing, rhonchi or rales.  Abdominal:     Palpations: Abdomen is soft.     Tenderness: There is abdominal tenderness. There is no right CVA tenderness or left CVA tenderness.     Comments: Abdomen nondistended, soft, no guarding rebound has peritoneal sign, very minimal suprapubic/left lower quadrant tenderness, no flank tenderness or CVA tenderness.  Musculoskeletal:     Right lower leg: No edema.     Left lower leg: No edema.   Skin:    General: Skin is warm and dry.  Neurological:     Mental Status: She is alert.  Psychiatric:        Mood and Affect: Mood normal.     Procedures  Procedures  ED Course / MDM    Medical Decision Making Amount and/or Complexity of Data Reviewed Labs: ordered. Radiology: ordered.  Risk Prescription drug management.    Lab Tests:  I Ordered, and personally interpreted labs.  The pertinent results include: CBC shows microcytic anemia hemoglobin 11.4, CMP reveals sodium 133, glucose 106, calcium 8.2, lipase 30, UA shows ketones, proteins, trace leukocytes red blood cells, no squamous cells present.  HCG negative   Imaging Studies ordered:  I ordered imaging studies including CT abdomen pelvis, transvaginal ultrasound I independently visualized and interpreted imaging which showed CT scan shows complex 4.5 cm cystic lesion left ovary, ultrasound reveals consistent 4.5 cystic lesion, follow-up in the next 6 to 12 weeks. I agree with the radiologist interpretation   Cardiac Monitoring:  The patient was maintained on a cardiac monitor.  I personally viewed and interpreted the cardiac monitored which showed an underlying rhythm of: N/A   Medicines ordered and prescription drug management:  I ordered medication including N/A I have reviewed the patients home medicines and have made adjustments as needed  Critical Interventions:  N/A   Reevaluation:  Patient is  that she is found asleep, resting comfortably, vital signs reassuring, abdomen remains soft with very minimal tenderness, updated on imaging, she is in agreement discharge at this time.  Consultations Obtained:  N/a    Test Considered:  N/a    Rule out Doubt ectopic pregnancy as urine pregnancy is negative.  I doubt ovarian torsion as ultrasound is negative.  I doubt PID no vaginal discharge no vaginal bleeding.  I doubt UTI [place] or kidney stone nonnursing any urinary symptoms UA is negative  for infection or hematuria.  I doubt bowel obstruction, volvulus, pancreatitis, pyelo-, kidney stone, intra-abdominal mass, abdominal infection and CT imaging negative the findings.   Dispostion and problem list  After consideration of the diagnostic results and the patients response to treatment, I feel that the patent would benefit from urgent.  Left ovarian cyst-patient was made aware of these findings, she was given a printout, of the ultrasound reading, likely cause of her pain, will recommend NSAIDs, follow-up with OB/GYN for further assessment.           Carroll Sage, PA-C 12/09/22 [id number]    Nicanor Alcon, April, MD 12/09/22 1191

## 2023-09-12 ENCOUNTER — Emergency Department (HOSPITAL_COMMUNITY)
Admission: EM | Admit: 2023-09-12 | Discharge: 2023-09-12 | Discharge status: Against Medical Advice | Attending: Emergency Medicine | Admitting: Emergency Medicine

## 2023-09-12 ENCOUNTER — Other Ambulatory Visit: Payer: Self-pay

## 2023-09-12 ENCOUNTER — Ambulatory Visit
Admission: EM | Admit: 2023-09-12 | Discharge: 2023-09-12 | Disposition: A | Discharge status: Home / Self Care | Attending: Family Medicine | Admitting: Family Medicine

## 2023-09-12 ENCOUNTER — Encounter (HOSPITAL_COMMUNITY): Payer: Self-pay | Admitting: Emergency Medicine

## 2023-09-12 DIAGNOSIS — R531 Weakness: Secondary | ICD-10-CM

## 2023-09-12 DIAGNOSIS — R197 Diarrhea, unspecified: Secondary | ICD-10-CM | POA: Diagnosis not present

## 2023-09-12 DIAGNOSIS — R42 Dizziness and giddiness: Secondary | ICD-10-CM | POA: Insufficient documentation

## 2023-09-12 DIAGNOSIS — Z5321 Procedure and treatment not carried out due to patient leaving prior to being seen by health care provider: Secondary | ICD-10-CM | POA: Diagnosis not present

## 2023-09-12 DIAGNOSIS — R509 Fever, unspecified: Secondary | ICD-10-CM | POA: Diagnosis not present

## 2023-09-12 DIAGNOSIS — R1010 Upper abdominal pain, unspecified: Secondary | ICD-10-CM | POA: Diagnosis present

## 2023-09-12 DIAGNOSIS — R55 Syncope and collapse: Secondary | ICD-10-CM | POA: Insufficient documentation

## 2023-09-12 LAB — CBC
HCT: 40.8 % (ref 36.0–46.0)
Hemoglobin: 12.3 g/dL (ref 12.0–15.0)
MCH: 21.8 pg — ABNORMAL LOW (ref 26.0–34.0)
MCHC: 30.1 g/dL (ref 30.0–36.0)
MCV: 72.2 fL — ABNORMAL LOW (ref 80.0–100.0)
Platelets: 274 10*3/uL (ref 150–400)
RBC: 5.65 MIL/uL — ABNORMAL HIGH (ref 3.87–5.11)
RDW: 16 % — ABNORMAL HIGH (ref 11.5–15.5)
WBC: 4.9 10*3/uL (ref 4.0–10.5)
nRBC: 0 % (ref 0.0–0.2)

## 2023-09-12 LAB — COMPREHENSIVE METABOLIC PANEL WITH GFR
ALT: 20 U/L (ref 0–44)
AST: 22 U/L (ref 15–41)
Albumin: 4 g/dL (ref 3.5–5.0)
Alkaline Phosphatase: 55 U/L (ref 38–126)
Anion gap: 6 (ref 5–15)
BUN: 15 mg/dL (ref 6–20)
CO2: 22 mmol/L (ref 22–32)
Calcium: 8.7 mg/dL — ABNORMAL LOW (ref 8.9–10.3)
Chloride: 106 mmol/L (ref 98–111)
Creatinine, Ser: 0.81 mg/dL (ref 0.44–1.00)
GFR, Estimated: >60 mL/min (ref >60)
Glucose, Bld: 97 mg/dL (ref 70–99)
Potassium: 3.6 mmol/L (ref 3.5–5.1)
Sodium: 134 mmol/L — ABNORMAL LOW (ref 135–145)
Total Bilirubin: 0.4 mg/dL (ref 0.0–1.2)
Total Protein: 7.9 g/dL (ref 6.5–8.1)

## 2023-09-12 LAB — HCG, SERUM, QUALITATIVE: Preg, Serum: NEGATIVE

## 2023-09-12 LAB — LIPASE, BLOOD: Lipase: 27 U/L (ref 11–51)

## 2023-09-12 NOTE — ED Notes (Signed)
 Initial V/S immediately after fall.

## 2023-09-12 NOTE — Discharge Instructions (Signed)
 To the ED by ambulance

## 2023-09-12 NOTE — ED Notes (Signed)
 Patient Discharged with EMS. Safety Zone to be completed asap.

## 2023-09-12 NOTE — ED Triage Notes (Signed)
 Patient did insist on setting up and going to bathroom because she "has had bad/uncontrolled diarrhea". I assisted patient up to bathroom discussing emergency protocol and recommendation to stay lying flat. She was able to use the restroom, return (while standing/walking) to a wheelchair where I took her to Room 1 awaiting transport by EMS.

## 2023-09-12 NOTE — ED Notes (Signed)
 Vital signs stable.

## 2023-09-12 NOTE — ED Notes (Signed)
 Patient is being discharged from the Urgent Care and sent to the Emergency Department via EMS . Per Provider, patient is in need of higher level of care due to Fall, LOC (Pre-visit/intake). Patient is aware and verbalizes understanding of plan of care.  Vitals:   09/12/23 1210 09/12/23 1236  BP: (!) 144/85 113/78  Pulse: (!) 36 95  Resp: (!) 22 18  Temp: 98 F (36.7 C)   SpO2: 98% 96%

## 2023-09-12 NOTE — ED Notes (Signed)
 Patient remains oriented to: Person, Place, Time and reason for visit to Urgent Care.

## 2023-09-12 NOTE — ED Notes (Signed)
 Pt attempted to ambulate to bathroom and fell in floor; pt alert at present and denies striking head; PB alerted and EMS called; Arlys John with pt

## 2023-09-12 NOTE — ED Notes (Signed)
 Patient denies pain "just soreness all over" and is resting in Exam chair (Room 1).
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
## 2023-09-12 NOTE — ED Triage Notes (Addendum)
 Pt bib [facility] for abdominal pain and uncontrolled diarrhea for 2 days. Dizziness per patient with upper abdominal pain. Had a near syncopal episode at urgent care due to dizziness. Noted blood in stool yesterday.  Fever of 103 last night.

## 2023-09-12 NOTE — ED Triage Notes (Signed)
 Patient was waiting near Urgent Care restroom for bathroom. This room was unavailable, while walking patient through doorway to use Primary Care bathroom, passed out with falling to the floor with LOC (? Head injury/hitting floor). I was able to catch her somewhat when falling by grabbing coat/jacket to ease landing/fall. After approximately 5-7 mins patient became alert again. See V/S for details. Provider and Nurse in clinic immediately notified when patient initially fell who called 911.

## 2023-09-13 ENCOUNTER — Encounter (HOSPITAL_COMMUNITY): Payer: Self-pay | Admitting: *Deleted

## 2023-09-13 ENCOUNTER — Emergency Department (HOSPITAL_COMMUNITY)
Admission: EM | Admit: 2023-09-13 | Discharge: 2023-09-13 | Discharge status: Against Medical Advice | Attending: Emergency Medicine | Admitting: Emergency Medicine

## 2023-09-13 DIAGNOSIS — R109 Unspecified abdominal pain: Secondary | ICD-10-CM | POA: Diagnosis not present

## 2023-09-13 DIAGNOSIS — R319 Hematuria, unspecified: Secondary | ICD-10-CM | POA: Insufficient documentation

## 2023-09-13 DIAGNOSIS — Z5321 Procedure and treatment not carried out due to patient leaving prior to being seen by health care provider: Secondary | ICD-10-CM | POA: Insufficient documentation

## 2023-09-13 DIAGNOSIS — R509 Fever, unspecified: Secondary | ICD-10-CM | POA: Diagnosis not present

## 2023-09-13 DIAGNOSIS — R197 Diarrhea, unspecified: Secondary | ICD-10-CM | POA: Diagnosis present

## 2023-09-13 LAB — URINALYSIS, MICROSCOPIC (REFLEX): RBC / HPF: >50 RBC/hpf (ref 0–5)

## 2023-09-13 LAB — URINALYSIS, ROUTINE W REFLEX MICROSCOPIC

## 2023-09-13 LAB — COMPREHENSIVE METABOLIC PANEL WITH GFR
ALT: 21 U/L (ref 0–44)
AST: 24 U/L (ref 15–41)
Albumin: 4.1 g/dL (ref 3.5–5.0)
Alkaline Phosphatase: 53 U/L (ref 38–126)
Anion gap: 13 (ref 5–15)
BUN: 11 mg/dL (ref 6–20)
CO2: 20 mmol/L — ABNORMAL LOW (ref 22–32)
Calcium: 9.3 mg/dL (ref 8.9–10.3)
Chloride: 102 mmol/L (ref 98–111)
Creatinine, Ser: 0.94 mg/dL (ref 0.44–1.00)
GFR, Estimated: >60 mL/min (ref >60)
Glucose, Bld: 119 mg/dL — ABNORMAL HIGH (ref 70–99)
Potassium: 3.4 mmol/L — ABNORMAL LOW (ref 3.5–5.1)
Sodium: 135 mmol/L (ref 135–145)
Total Bilirubin: 0.3 mg/dL (ref 0.0–1.2)
Total Protein: 8.4 g/dL — ABNORMAL HIGH (ref 6.5–8.1)

## 2023-09-13 LAB — LIPASE, BLOOD: Lipase: 29 U/L (ref 11–51)

## 2023-09-13 LAB — CBC
HCT: 45.9 % (ref 36.0–46.0)
Hemoglobin: 14.1 g/dL (ref 12.0–15.0)
MCH: 21.9 pg — ABNORMAL LOW (ref 26.0–34.0)
MCHC: 30.7 g/dL (ref 30.0–36.0)
MCV: 71.4 fL — ABNORMAL LOW (ref 80.0–100.0)
Platelets: 323 10*3/uL (ref 150–400)
RBC: 6.43 MIL/uL — ABNORMAL HIGH (ref 3.87–5.11)
RDW: 17 % — ABNORMAL HIGH (ref 11.5–15.5)
WBC: 5.6 10*3/uL (ref 4.0–10.5)
nRBC: 0 % (ref 0.0–0.2)

## 2023-09-13 LAB — HCG, SERUM, QUALITATIVE: Preg, Serum: NEGATIVE

## 2023-09-13 MED ORDER — LOPERAMIDE HCL 2 MG PO CAPS
4.0000 mg | ORAL_CAPSULE | Freq: Once | ORAL | Status: AC
  Administered 2023-09-13: 4 mg via ORAL
  Filled 2023-09-13: qty 2

## 2023-09-13 NOTE — ED Triage Notes (Addendum)
 Pt has been having diarrhea since Monday.  Pt has been having fever with this.  Pt was seen at [facility] yesterday for this and she had a syncopal espisode and she was told to go to ED but she was not able to stay.  Pt has continued to have diarrhea

## 2023-09-13 NOTE — ED Notes (Signed)
Pt reports vaginal bleeding

## 2023-09-13 NOTE — ED Notes (Signed)
 Pt. Called for repeta vitals x3 w/ no response. Moved OTF

## 2023-09-13 NOTE — ED Provider Triage Note (Signed)
 Emergency Medicine Provider Triage Evaluation Note  Cathy Elliott , a 33 y.o. female  was evaluated in triage.  Pt complains of diarrhea. Patient reports that she has been having diarrhea for about 3 days. No sick contacts or individuals around her with similar symptoms. She states that she has been noticing some blood in her diarrhea and also endorsing hematuria. Bilateral flank pain present. Denies any nausea or vomiting. She does endorse fevers and chills.  Review of Systems  Positive: As above Negative: As above  Physical Exam  BP (!) 150/106   Pulse (!) 104   Temp 98 F (36.7 C)   Resp 18   LMP 08/25/2023   SpO2 100%  Gen:   Awake, no distress   Resp:  Normal effort  MSK:   Moves extremities without difficulty  Other:  Diffuse TTP in the abdomen, bilateral CVA tenderness  Medical Decision Making  Medically screening exam initiated at 1:24 PM.  Appropriate orders placed.  Cathy Elliott was informed that the remainder of the evaluation will be completed by another provider, this initial triage assessment does not replace that evaluation, and the importance of remaining in the ED until their evaluation is complete.     Smitty Knudsen, PA-C 09/13/23 1326
